# Patient Record
Sex: Female | Born: 1960 | Race: White | Hispanic: No | Marital: Married | Smoking: Current every day smoker
Health system: Southern US, Community
[De-identification: ages and names within clinical notes are randomized; demographics above are authoritative.]

## PROBLEM LIST (undated history)

## (undated) DIAGNOSIS — K921 Melena: Secondary | ICD-10-CM

## (undated) DIAGNOSIS — T7840XA Allergy, unspecified, initial encounter: Secondary | ICD-10-CM

## (undated) DIAGNOSIS — J40 Bronchitis, not specified as acute or chronic: Secondary | ICD-10-CM

## (undated) DIAGNOSIS — R51 Headache: Secondary | ICD-10-CM

## (undated) DIAGNOSIS — D649 Anemia, unspecified: Secondary | ICD-10-CM

## (undated) DIAGNOSIS — R519 Headache, unspecified: Secondary | ICD-10-CM

## (undated) DIAGNOSIS — G8929 Other chronic pain: Secondary | ICD-10-CM

## (undated) DIAGNOSIS — S2220XA Unspecified fracture of sternum, initial encounter for closed fracture: Secondary | ICD-10-CM

## (undated) DIAGNOSIS — O24419 Gestational diabetes mellitus in pregnancy, unspecified control: Secondary | ICD-10-CM

## (undated) DIAGNOSIS — R198 Other specified symptoms and signs involving the digestive system and abdomen: Secondary | ICD-10-CM

## (undated) HISTORY — PX: BREAST LUMPECTOMY: SHX2

## (undated) HISTORY — DX: Allergy, unspecified, initial encounter: T78.40XA

## (undated) HISTORY — DX: Other specified symptoms and signs involving the digestive system and abdomen: R19.8

## (undated) HISTORY — DX: Bronchitis, not specified as acute or chronic: J40

## (undated) HISTORY — PX: ABDOMINAL HYSTERECTOMY: SHX81

## (undated) HISTORY — DX: Headache: R51

## (undated) HISTORY — DX: Anemia, unspecified: D64.9

## (undated) HISTORY — DX: Unspecified fracture of sternum, initial encounter for closed fracture: S22.20XA

## (undated) HISTORY — DX: Gestational diabetes mellitus in pregnancy, unspecified control: O24.419

## (undated) HISTORY — PX: TUBAL LIGATION: SHX77

## (undated) HISTORY — DX: Headache, unspecified: R51.9

## (undated) HISTORY — DX: Other chronic pain: G89.29

## (undated) HISTORY — DX: Melena: K92.1

---

## 2008-04-20 ENCOUNTER — Ambulatory Visit: Payer: Self-pay | Admitting: Unknown Physician Specialty

## 2008-05-07 ENCOUNTER — Ambulatory Visit: Payer: Self-pay | Admitting: Unknown Physician Specialty

## 2012-06-20 ENCOUNTER — Ambulatory Visit (INDEPENDENT_AMBULATORY_CARE_PROVIDER_SITE_OTHER): Payer: 59 | Admitting: Internal Medicine

## 2012-06-20 ENCOUNTER — Encounter: Payer: Self-pay | Admitting: Internal Medicine

## 2012-06-20 ENCOUNTER — Other Ambulatory Visit: Payer: Self-pay | Admitting: Internal Medicine

## 2012-06-20 VITALS — BP 120/80 | HR 88 | Temp 98.1°F | Ht 65.0 in | Wt 245.0 lb

## 2012-06-20 DIAGNOSIS — R1031 Right lower quadrant pain: Secondary | ICD-10-CM | POA: Insufficient documentation

## 2012-06-20 DIAGNOSIS — R0609 Other forms of dyspnea: Secondary | ICD-10-CM | POA: Insufficient documentation

## 2012-06-20 DIAGNOSIS — M25552 Pain in left hip: Secondary | ICD-10-CM

## 2012-06-20 DIAGNOSIS — R197 Diarrhea, unspecified: Secondary | ICD-10-CM | POA: Insufficient documentation

## 2012-06-20 DIAGNOSIS — K921 Melena: Secondary | ICD-10-CM

## 2012-06-20 DIAGNOSIS — F17200 Nicotine dependence, unspecified, uncomplicated: Secondary | ICD-10-CM | POA: Insufficient documentation

## 2012-06-20 DIAGNOSIS — M25561 Pain in right knee: Secondary | ICD-10-CM | POA: Insufficient documentation

## 2012-06-20 DIAGNOSIS — M25562 Pain in left knee: Secondary | ICD-10-CM

## 2012-06-20 DIAGNOSIS — M25559 Pain in unspecified hip: Secondary | ICD-10-CM

## 2012-06-20 DIAGNOSIS — M25569 Pain in unspecified knee: Secondary | ICD-10-CM

## 2012-06-20 NOTE — Assessment & Plan Note (Signed)
Body mass index is 40.77 kg/(m^2).  Encouraged effort at healthy diet. Exercise limited at present by knee pain and hip pain. Will check labs including A1c and TSH.

## 2012-06-20 NOTE — Assessment & Plan Note (Signed)
Likely secondary to osteoarthritis. Will request records from previous orthopedic evaluation. After review of previous imaging, will decide on additional evaluation.

## 2012-06-20 NOTE — Assessment & Plan Note (Signed)
Symptoms of dyspnea on exertion are most likely related to tobacco abuse, COPD, obesity. Cardiac ischemia is also a consideration given patient's history of tobacco abuse and obesity. EKG showed no acute findings today. Discussed potential referral for additional evaluation with stress test, however will hold off on evaluation until after acute issues with GI bleeding resolved.

## 2012-06-20 NOTE — Assessment & Plan Note (Signed)
Symptoms and exam are most consistent with osteoarthritis. Will request records on previous orthopedic evaluation.

## 2012-06-20 NOTE — Assessment & Plan Note (Signed)
Encouraged smoking cessation. Pt does not feel ready to quit.

## 2012-06-20 NOTE — Assessment & Plan Note (Signed)
Chronic, watery diarrhea on daily basis. Given intermittent episodes of grossly bloody stools, question if symptoms may be related to inflammatory bowel disease. Will get CT abdomen. Will set up GI evaluation for colonoscopy. Will check CMP, CBC with labs.

## 2012-06-20 NOTE — Assessment & Plan Note (Signed)
Chronic RLQ abdominal pain. Question if pt may have chronic appendicitis or IBD contributing to symptoms. Will get CT abdomen for further evaluation.

## 2012-06-20 NOTE — Progress Notes (Signed)
Subjective:    Patient ID: Briana Walter, female    DOB: 1961-02-18, 52 y.o.   MRN: [id number]  HPI 52 year old female with history of obesity, tobacco abuse, chronic bilateral knee and hip pain, and chronic diarrhea presents to establish care. She has several concerns today. First, she notes several year history of watery diarrhea. She typically has greater than 5-10 episodes of watery diarrhea per day. Occasionally, she has grossly bloody stools. This is typically associated with right lower abdominal pain and sometimes right back pain. She had a colonoscopy in the distant past which she reports was normal. She has not had any weight loss with these episodes. She denies persistent abdominal pain, fever, chills. She does have acid reflux symptoms which are improved with Zantac. She describes this as burning with occasional nausea and belching.  Second, she notes significant dyspnea on exertion. She reports that she sometimes become short of breath after walking several 100 feet. She is a smoker and smokes about one pack per day. She does not exercise. In the past, she has been treated for recurrent episodes of bronchitis. She has never been diagnosed with COPD. She denies any chest pain with exertion. She denies diaphoresis or palpitations.   Third, she is concerned about chronic bilateral knee and left hip pain. In the past she was evaluated by orthopedic surgery and reports having imaging of her knees. She has been using Aleve to help with symptoms of pain with minimal improvement. The pain in her knees is described as aching occasionally wakes her from sleep.  Fourth, in regards to smoking, she reports she would like to quit smoking but is concerned about the potential of gaining more weight. She does not feel ready to quit.  Outpatient Encounter Prescriptions as of 06/20/2012  Medication Sig Dispense Refill  . diphenhydrAMINE (BENADRYL) 50 MG capsule Take 50 mg by mouth every 6 (six) hours as  needed for itching.      . loratadine (CLARITIN) 10 MG tablet Take 10 mg by mouth daily.      . naproxen sodium (ANAPROX) 220 MG tablet Take 220 mg by mouth 2 (two) times daily with a meal.      . ranitidine (ZANTAC) 150 MG tablet Take 150 mg by mouth 2 (two) times daily.       No facility-administered encounter medications on file as of 06/20/2012.   BP 120/80  Pulse 88  Temp(Src) 98.1 F (36.7 C) (Oral)  Ht 5\' 5"  (1.651 m)  Wt 245 lb (111.131 kg)  BMI 40.77 kg/m2  SpO2 97%  Review of Systems  Constitutional: Positive for fatigue. Negative for fever, chills, appetite change and unexpected weight change.  HENT: Negative for ear pain, congestion, sore throat, trouble swallowing, neck pain, voice change and sinus pressure.   Eyes: Negative for visual disturbance.  Respiratory: Positive for shortness of breath. Negative for cough, wheezing and stridor.   Cardiovascular: Negative for chest pain, palpitations and leg swelling.  Gastrointestinal: Positive for nausea, abdominal pain, diarrhea and blood in stool. Negative for vomiting, constipation, abdominal distention and anal bleeding.  Genitourinary: Negative for dysuria and flank pain.  Musculoskeletal: Positive for myalgias and arthralgias. Negative for gait problem.  Skin: Negative for color change and rash.  Neurological: Negative for dizziness and headaches.  Hematological: Negative for adenopathy. Does not bruise/bleed easily.  Psychiatric/Behavioral: Negative for suicidal ideas, sleep disturbance and dysphoric mood. The patient is not nervous/anxious.        Objective:   Physical Exam  Constitutional: She is oriented to person, place, and time. She appears well-developed and well-nourished. No distress.  HENT:  Head: Normocephalic and atraumatic.  Right Ear: External ear normal.  Left Ear: External ear normal.  Nose: Nose normal.  Mouth/Throat: Oropharynx is clear and moist. No oropharyngeal exudate.  Eyes: Conjunctivae are  normal. Pupils are equal, round, and reactive to light. Right eye exhibits no discharge. Left eye exhibits no discharge. No scleral icterus.  Neck: Normal range of motion. Neck supple. No tracheal deviation present. No thyromegaly present.  Cardiovascular: Normal rate, regular rhythm, normal heart sounds and intact distal pulses.  Exam reveals no gallop and no friction rub.   No murmur heard. Pulmonary/Chest: Effort normal and breath sounds normal. No accessory muscle usage. Not tachypneic. No respiratory distress. She has no decreased breath sounds. She has no wheezes. She has no rhonchi. She has no rales. She exhibits no tenderness.  Abdominal: Normal appearance and bowel sounds are normal. There is tenderness in the right lower quadrant. There is tenderness at McBurney's point. There is no CVA tenderness.  Musculoskeletal: She exhibits no edema and no tenderness.       Right knee: She exhibits normal range of motion, no swelling and no effusion.       Left knee: She exhibits normal range of motion, no swelling and no effusion.  Crepitus bilateral knees  Lymphadenopathy:    She has no cervical adenopathy.  Neurological: She is alert and oriented to person, place, and time. No cranial nerve deficit. She exhibits normal muscle tone. Coordination normal.  Skin: Skin is warm and dry. No rash noted. She is not diaphoretic. No erythema. No pallor.  Psychiatric: She has a normal mood and affect. Her behavior is normal. Judgment and thought content normal.          Assessment & Plan:

## 2012-06-20 NOTE — Assessment & Plan Note (Signed)
Chronic intermittent episodes of grossly bloody stool associated with abdominal pain are concerning for inflammatory colitis. Will get CT of the abdomen for further evaluation. Will check CBC, CMP with labs today. Will set up direct visualization with colonoscopy with GI referral.

## 2012-06-21 ENCOUNTER — Ambulatory Visit: Payer: Self-pay | Admitting: Internal Medicine

## 2012-06-23 LAB — CBC WITH DIFFERENTIAL
Basophils Absolute: 0 10*3/uL (ref 0.0–0.2)
HCT: 42.4 % (ref 34.0–46.6)
Lymphocytes Absolute: 1.7 10*3/uL (ref 0.7–3.1)
MCV: 86 fL (ref 79–97)
Monocytes: 7 % (ref 4–12)
Neutrophils Absolute: 3.2 10*3/uL (ref 1.4–7.0)
RDW: 13.1 % (ref 12.3–15.4)
WBC: 5.4 10*3/uL (ref 3.4–10.8)

## 2012-06-23 LAB — COMPREHENSIVE METABOLIC PANEL
Albumin/Globulin Ratio: 1.9 (ref 1.1–2.5)
Albumin: 4.6 g/dL (ref 3.5–5.5)
Alkaline Phosphatase: 73 IU/L (ref 39–117)
BUN/Creatinine Ratio: 15 (ref 9–23)
BUN: 13 mg/dL (ref 6–24)
Chloride: 104 mmol/L (ref 97–108)
Creatinine, Ser: 0.85 mg/dL (ref 0.57–1.00)
GFR calc Af Amer: 93 mL/min/{1.73_m2} (ref >59)
Globulin, Total: 2.4 g/dL (ref 1.5–4.5)
Total Bilirubin: 0.5 mg/dL (ref 0.0–1.2)

## 2012-06-23 LAB — LIPID PANEL W/O CHOL/HDL RATIO
HDL: 54 mg/dL (ref >39)
LDL Calculated: 136 mg/dL — ABNORMAL HIGH (ref 0–99)

## 2012-06-23 LAB — HGB A1C W/O EAG: Hgb A1c MFr Bld: 5.5 % (ref 4.8–5.6)

## 2012-06-23 LAB — H. PYLORI BREATH TEST: H. pylori UBiT: NEGATIVE

## 2012-06-23 LAB — TSH: TSH: 1.63 u[IU]/mL (ref 0.450–4.500)

## 2012-06-24 ENCOUNTER — Encounter: Payer: Self-pay | Admitting: Internal Medicine

## 2012-06-24 ENCOUNTER — Telehealth: Payer: Self-pay | Admitting: Internal Medicine

## 2012-06-24 NOTE — Telephone Encounter (Signed)
CT abdomen 06/21/2012 was normal.

## 2012-06-25 NOTE — Telephone Encounter (Signed)
Left message to call back  

## 2012-06-26 NOTE — Telephone Encounter (Signed)
Patient informed and verbally agreed understanding.  

## 2012-06-26 NOTE — Telephone Encounter (Signed)
Left message to call back  

## 2012-07-04 ENCOUNTER — Ambulatory Visit: Payer: 59 | Admitting: Internal Medicine

## 2012-07-11 ENCOUNTER — Encounter: Payer: Self-pay | Admitting: Internal Medicine

## 2013-01-09 ENCOUNTER — Other Ambulatory Visit: Payer: Self-pay

## 2014-03-18 ENCOUNTER — Telehealth: Payer: Self-pay | Admitting: Internal Medicine

## 2014-03-18 NOTE — Telephone Encounter (Signed)
Sent mychart with advice. Also advised pt she needs to schedule appt with dr. Dan HumphreysWalker, been almost 2 years since last appt 4/14.

## 2014-03-18 NOTE — Telephone Encounter (Addendum)
Pt request to have a mammogram ordered and scheduled on a Friday. Pt also request for a colonoscopy.Please advise pt/msn

## 2014-04-17 ENCOUNTER — Ambulatory Visit (INDEPENDENT_AMBULATORY_CARE_PROVIDER_SITE_OTHER): Payer: 59 | Admitting: Internal Medicine

## 2014-04-17 ENCOUNTER — Encounter: Payer: Self-pay | Admitting: Internal Medicine

## 2014-04-17 VITALS — BP 132/84 | HR 80 | Temp 98.0°F | Resp 14 | Ht 65.25 in | Wt 245.5 lb

## 2014-04-17 DIAGNOSIS — L292 Pruritus vulvae: Secondary | ICD-10-CM

## 2014-04-17 DIAGNOSIS — Z0000 Encounter for general adult medical examination without abnormal findings: Secondary | ICD-10-CM | POA: Insufficient documentation

## 2014-04-17 DIAGNOSIS — G4730 Sleep apnea, unspecified: Secondary | ICD-10-CM

## 2014-04-17 DIAGNOSIS — Z720 Tobacco use: Secondary | ICD-10-CM

## 2014-04-17 DIAGNOSIS — F17200 Nicotine dependence, unspecified, uncomplicated: Secondary | ICD-10-CM

## 2014-04-17 DIAGNOSIS — Z634 Disappearance and death of family member: Secondary | ICD-10-CM

## 2014-04-17 LAB — HM PAP SMEAR: HM PAP: NEGATIVE

## 2014-04-17 MED ORDER — NYSTATIN 100000 UNIT/GM EX POWD: CUTANEOUS | Status: DC

## 2014-04-17 MED ORDER — GENTAMICIN SULFATE 0.1 % EX OINT: 1.0000 "application " | TOPICAL_OINTMENT | Freq: Three times a day (TID) | CUTANEOUS | Status: DC

## 2014-04-17 NOTE — Progress Notes (Signed)
Subjective:    Patient ID: Carola RhineLeslie T Scarbro, female    DOB: 11/15/1960, 54 y.o.   MRN: [id number]  HPI  54YO female presents for annual exam.  Vulvar itching - ongoing over last several weeks. Only itching externally. No vaginal discharge noted. Not using anything for this.  Sleep apnea - Husband has reported she is snoring and also has apneic events. Feels tired during the day. Having some trouble staying asleep  Son committed suicide last year. Continues to struggle with this. Tearful at times. Has not yet sought counseling, but considering this.  Past medical, surgical, family and social history per today's encounter.  Review of Systems  Constitutional: Negative for fever, chills, appetite change, fatigue and unexpected weight change.  Eyes: Negative for visual disturbance.  Respiratory: Negative for shortness of breath.   Cardiovascular: Negative for chest pain and leg swelling.  Gastrointestinal: Negative for nausea, vomiting, abdominal pain, diarrhea and constipation.  Genitourinary: Negative for vaginal bleeding, vaginal discharge, genital sores, vaginal pain and pelvic pain.  Musculoskeletal: Negative for myalgias and arthralgias.  Skin: Positive for rash.  Hematological: Negative for adenopathy. Does not bruise/bleed easily.  Psychiatric/Behavioral: Positive for sleep disturbance and dysphoric mood. Negative for suicidal ideas. The patient is not nervous/anxious.        Objective:    BP 132/84 mmHg  Pulse 80  Temp(Src) 98 F (36.7 C) (Oral)  Resp 14  Ht 5' 5.25" (1.657 m)  Wt 245 lb 8 oz (111.358 kg)  BMI 40.56 kg/m2  SpO2 97% Physical Exam  Constitutional: She is oriented to person, place, and time. She appears well-developed and well-nourished. No distress.  HENT:  Head: Normocephalic and atraumatic.  Right Ear: External ear normal.  Left Ear: External ear normal.  Nose: Nose normal.  Mouth/Throat: Oropharynx is clear and moist. No oropharyngeal exudate.    Eyes: Conjunctivae are normal. Pupils are equal, round, and reactive to light. Right eye exhibits no discharge. Left eye exhibits no discharge. No scleral icterus.  Neck: Normal range of motion. Neck supple. No tracheal deviation present. No thyromegaly present.  Cardiovascular: Normal rate, regular rhythm, normal heart sounds and intact distal pulses.  Exam reveals no gallop and no friction rub.   No murmur heard. Pulmonary/Chest: Effort normal and breath sounds normal. No respiratory distress. She has no wheezes. She has no rales. She exhibits no tenderness.  Abdominal: Soft. Bowel sounds are normal. She exhibits no distension and no mass. There is no tenderness. There is no rebound and no guarding.  Genitourinary: Rectum normal, vagina normal and uterus normal. No breast swelling, tenderness, discharge or bleeding. Pelvic exam was performed with patient supine. There is no rash, tenderness or lesion on the right labia. There is no rash, tenderness or lesion on the left labia. Uterus is not enlarged and not tender. Cervix exhibits no motion tenderness, no discharge and no friability. Right adnexum displays no mass, no tenderness and no fullness. Left adnexum displays no mass, no tenderness and no fullness. No erythema or tenderness in the vagina. No vaginal discharge found.  Musculoskeletal: Normal range of motion. She exhibits no edema or tenderness.  Lymphadenopathy:    She has no cervical adenopathy.  Neurological: She is alert and oriented to person, place, and time. No cranial nerve deficit. She exhibits normal muscle tone. Coordination normal.  Skin: Skin is warm and dry. No rash noted. She is not diaphoretic. No erythema. No pallor.     Psychiatric: She has a normal mood and affect.  Her behavior is normal. Judgment and thought content normal.          Assessment & Plan:   Problem List Items Addressed This Visit      Unprioritized   Bereavement    Encouraged her to consider  referral for counseling given recent death of her son. She will look into counseling at work.      Routine general medical examination at a health care facility - Primary    General medical exam including breast and pelvic exam normal today except as noted. PAP pending. Mammogram ordered. Colonoscopy ordered. Labs today including CBC, CMP, lipids, TSH, A1c, Vit D. Flu vaccine declined.      Relevant Orders   MM Digital Screening   Ambulatory referral to Gastroenterology   Hemoglobin A1c   TSH   Hepatitis B surface antigen   Hepatitis B surface antibody   Hepatitis C antibody   HIV antibody   Comprehensive metabolic panel   Lipid panel   CBC with Differential/Platelet   Vit D  25 hydroxy (rtn osteoporosis monitoring)   GC/chlamydia probe amp, urine   Severe obesity (BMI >= 40)    Wt Readings from Last 3 Encounters:  04/17/14 245 lb 8 oz (111.358 kg)  06/20/12 245 lb (111.131 kg)   Body mass index is 40.56 kg/(m^2). Encouraged healthy diet and exercise. Gave handout on this.      Sleep apnea    Symptoms of witnessed snoring and apnea. Encouraged her to consider sleep study. She declines for now.       Tobacco use disorder    Encourage smoking cessation.      Vulvar itching    Exam is consistent with cutaneous candidiasis with small area of folliculitis left upper groin. Will start topical Gentamicin to folliculitis and use bid Nystatin powder to treat candidiasis. Follow up prn.      Relevant Medications   Nystatin (MYCOSTATIN) 100,000 units/Gm top powder   gentamicin (GARAMYCIN) ointment 0.1%       Return in about 3 months (around 07/16/2014) for Recheck.

## 2014-04-17 NOTE — Assessment & Plan Note (Signed)
Wt Readings from Last 3 Encounters:  04/17/14 245 lb 8 oz (111.358 kg)  06/20/12 245 lb (111.131 kg)   Body mass index is 40.56 kg/(m^2). Encouraged healthy diet and exercise. Gave handout on this.

## 2014-04-17 NOTE — Progress Notes (Signed)
Pre visit review using our clinic review tool, if applicable. No additional management support is needed unless otherwise documented below in the visit note. 

## 2014-04-17 NOTE — Assessment & Plan Note (Signed)
Encourage smoking cessation

## 2014-04-17 NOTE — Assessment & Plan Note (Signed)
Symptoms of witnessed snoring and apnea. Encouraged her to consider sleep study. She declines for now.

## 2014-04-17 NOTE — Patient Instructions (Signed)

## 2014-04-17 NOTE — Assessment & Plan Note (Signed)
General medical exam including breast and pelvic exam normal today except as noted. PAP pending. Mammogram ordered. Colonoscopy ordered. Labs today including CBC, CMP, lipids, TSH, A1c, Vit D. Flu vaccine declined.

## 2014-04-17 NOTE — Assessment & Plan Note (Signed)
Encouraged her to consider referral for counseling given recent death of her son. She will look into counseling at work.

## 2014-04-17 NOTE — Assessment & Plan Note (Signed)
Exam is consistent with cutaneous candidiasis with small area of folliculitis left upper groin. Will start topical Gentamicin to folliculitis and use bid Nystatin powder to treat candidiasis. Follow up prn.

## 2014-04-17 NOTE — Addendum Note (Signed)
Addended by: Montine CircleMALDONADO, Armanie Martine D on: 04/17/2014 02:17 PM   Modules accepted: Orders

## 2014-04-18 LAB — GC/CHLAMYDIA PROBE AMP, URINE
Chlamydia, Swab/Urine, PCR: NEGATIVE
GC Probe Amp, Urine: NEGATIVE

## 2014-04-20 ENCOUNTER — Telehealth: Payer: Self-pay | Admitting: Internal Medicine

## 2014-04-20 ENCOUNTER — Telehealth: Payer: Self-pay | Admitting: *Deleted

## 2014-04-20 NOTE — Telephone Encounter (Signed)
Spoke with Briana Walter, she states all other labs will be resulted.

## 2014-04-20 NOTE — Telephone Encounter (Signed)
OK. Please just ask them to run PAP with HPV then

## 2014-04-20 NOTE — Telephone Encounter (Signed)
Dana from [facility] called states they are unable to result the Candida and Gardnerella.  Those result can not be processed from a Thin Prep, they have to done from an APTIMA swab.  Please advise

## 2014-04-20 NOTE — Telephone Encounter (Signed)
emmi emailed °

## 2014-04-21 LAB — CBC WITH DIFFERENTIAL/PLATELET
Basophils Absolute: 0 10*3/uL (ref 0.0–0.2)
Basos: 1 %
Eos: 3 %
Eosinophils Absolute: 0.1 10*3/uL (ref 0.0–0.4)
HCT: 39.6 % (ref 34.0–46.6)
Hemoglobin: 13.9 g/dL (ref 11.1–15.9)
IMMATURE GRANS (ABS): 0 10*3/uL (ref 0.0–0.1)
Immature Granulocytes: 0 %
LYMPHS: 33 %
Lymphocytes Absolute: 1.2 10*3/uL (ref 0.7–3.1)
MCH: 30.5 pg (ref 26.6–33.0)
MCHC: 35.1 g/dL (ref 31.5–35.7)
MCV: 87 fL (ref 79–97)
Monocytes Absolute: 0.3 10*3/uL (ref 0.1–0.9)
Monocytes: 7 %
NEUTROS PCT: 56 %
Neutrophils Absolute: 2.2 10*3/uL (ref 1.4–7.0)
PLATELETS: 325 10*3/uL (ref 150–379)
RBC: 4.55 x10E6/uL (ref 3.77–5.28)
RDW: 13.1 % (ref 12.3–15.4)
WBC: 3.8 10*3/uL (ref 3.4–10.8)

## 2014-04-21 LAB — LIPID PANEL
Chol/HDL Ratio: 4.5 ratio units — ABNORMAL HIGH (ref 0.0–4.4)
Cholesterol, Total: 245 mg/dL — ABNORMAL HIGH (ref 100–199)
HDL: 55 mg/dL (ref >39)
LDL Calculated: 159 mg/dL — ABNORMAL HIGH (ref 0–99)
Triglycerides: 153 mg/dL — ABNORMAL HIGH (ref 0–149)
VLDL Cholesterol Cal: 31 mg/dL (ref 5–40)

## 2014-04-21 LAB — HEMOGLOBIN A1C
Est. average glucose Bld gHb Est-mCnc: 105 mg/dL
Hgb A1c MFr Bld: 5.3 % (ref 4.8–5.6)

## 2014-04-21 LAB — COMPREHENSIVE METABOLIC PANEL
ALT: 22 IU/L (ref 0–32)
AST: 21 IU/L (ref 0–40)
Albumin/Globulin Ratio: 1.9 (ref 1.1–2.5)
Albumin: 4.3 g/dL (ref 3.5–5.5)
Alkaline Phosphatase: 71 IU/L (ref 39–117)
BUN / CREAT RATIO: 24 — AB (ref 9–23)
BUN: 16 mg/dL (ref 6–24)
Bilirubin Total: 0.4 mg/dL (ref 0.0–1.2)
CHLORIDE: 105 mmol/L (ref 97–108)
CO2: 21 mmol/L (ref 18–29)
Calcium: 9.2 mg/dL (ref 8.7–10.2)
Creatinine, Ser: 0.67 mg/dL (ref 0.57–1.00)
GFR calc Af Amer: 118 mL/min/{1.73_m2} (ref >59)
GFR calc non Af Amer: 102 mL/min/{1.73_m2} (ref >59)
GLUCOSE: 102 mg/dL — AB (ref 65–99)
Globulin, Total: 2.3 g/dL (ref 1.5–4.5)
POTASSIUM: 4.4 mmol/L (ref 3.5–5.2)
Sodium: 140 mmol/L (ref 134–144)
Total Protein: 6.6 g/dL (ref 6.0–8.5)

## 2014-04-21 LAB — HIV ANTIBODY (ROUTINE TESTING W REFLEX): HIV Screen 4th Generation wRfx: NONREACTIVE

## 2014-04-21 LAB — VITAMIN D 25 HYDROXY (VIT D DEFICIENCY, FRACTURES): VIT D 25 HYDROXY: 12 ng/mL — AB (ref 30.0–100.0)

## 2014-04-21 LAB — HEPATITIS B SURFACE ANTIGEN: Hepatitis B Surface Ag: NEGATIVE

## 2014-04-21 LAB — HEPATITIS C ANTIBODY: Hep C Virus Ab: <0.1 s/co ratio (ref 0.0–0.9)

## 2014-04-21 LAB — TSH: TSH: 1.71 u[IU]/mL (ref 0.450–4.500)

## 2014-04-21 LAB — HEPATITIS B SURFACE ANTIBODY,QUALITATIVE: Hep B Surface Ab, Qual: NONREACTIVE

## 2014-05-14 ENCOUNTER — Telehealth: Payer: Self-pay | Admitting: *Deleted

## 2014-05-14 NOTE — Telephone Encounter (Signed)
Pt left VM, requesting pap results. [name] requested results from [facility]. Pap negative, High risk HPV negative. Left message, notifying of results.

## 2014-06-23 LAB — HM COLONOSCOPY

## 2014-06-30 ENCOUNTER — Encounter: Payer: Self-pay | Admitting: *Deleted

## 2014-07-16 ENCOUNTER — Encounter: Payer: Self-pay | Admitting: Internal Medicine

## 2014-07-16 ENCOUNTER — Ambulatory Visit (INDEPENDENT_AMBULATORY_CARE_PROVIDER_SITE_OTHER): Payer: 59 | Admitting: Internal Medicine

## 2014-07-16 VITALS — BP 138/80 | HR 115 | Temp 98.0°F | Ht 65.25 in | Wt 236.5 lb

## 2014-07-16 DIAGNOSIS — R197 Diarrhea, unspecified: Secondary | ICD-10-CM

## 2014-07-16 NOTE — Patient Instructions (Signed)

## 2014-07-16 NOTE — Progress Notes (Signed)
Pre visit review using our clinic review tool, if applicable. No additional management support is needed unless otherwise documented below in the visit note. 

## 2014-07-16 NOTE — Progress Notes (Signed)
Subjective:    Patient ID: Briana RhineLeslie T Vanderweele, female    DOB: 09/18/1960, 54 y.o.   MRN: [id number]  HPI  54YO female presents for acute visit.  Diarrhea - Started Sunday night. Watery, non-bloody. Numerous episodes per day. Some cramping prior to episodes. No travel. No NV. No fever. Stayed home from work Monday and Tuesday. Worked from home yesterday. No other family members ill. Also notes that throat has felt "tickly" and has had dry cough. Sipping on sprite today, but has not eaten.  Wt Readings from Last 3 Encounters:  07/16/14 236 lb 8 oz (107.276 kg)  04/17/14 245 lb 8 oz (111.358 kg)  06/20/12 245 lb (111.131 kg)     Past medical, surgical, family and social history per today's encounter.  Review of Systems  Constitutional: Positive for fatigue. Negative for fever, chills, appetite change and unexpected weight change.  Eyes: Negative for visual disturbance.  Respiratory: Positive for cough. Negative for shortness of breath.   Cardiovascular: Negative for chest pain and leg swelling.  Gastrointestinal: Positive for abdominal pain and diarrhea. Negative for nausea, vomiting, constipation and blood in stool.  Skin: Negative for color change and rash.  Hematological: Negative for adenopathy. Does not bruise/bleed easily.  Psychiatric/Behavioral: Negative for dysphoric mood. The patient is not nervous/anxious.        Objective:    BP 138/80 mmHg  Pulse 115  Temp(Src) 98 F (36.7 C) (Oral)  Ht 5' 5.25" (1.657 m)  Wt 236 lb 8 oz (107.276 kg)  BMI 39.07 kg/m2  SpO2 94% Physical Exam  Constitutional: She is oriented to person, place, and time. She appears well-developed and well-nourished. No distress.  HENT:  Head: Normocephalic and atraumatic.  Right Ear: External ear normal.  Left Ear: External ear normal.  Nose: Nose normal.  Mouth/Throat: Oropharynx is clear and moist. No oropharyngeal exudate.  Eyes: Conjunctivae are normal. Pupils are equal, round, and  reactive to light. Right eye exhibits no discharge. Left eye exhibits no discharge. No scleral icterus.  Neck: Normal range of motion. Neck supple. No tracheal deviation present. No thyromegaly present.  Cardiovascular: Normal rate, regular rhythm, normal heart sounds and intact distal pulses.  Exam reveals no gallop and no friction rub.   No murmur heard. Pulmonary/Chest: Effort normal and breath sounds normal. No respiratory distress. She has no wheezes. She has no rales. She exhibits no tenderness.  Musculoskeletal: Normal range of motion. She exhibits no edema or tenderness.  Lymphadenopathy:    She has no cervical adenopathy.  Neurological: She is alert and oriented to person, place, and time. No cranial nerve deficit. She exhibits normal muscle tone. Coordination normal.  Skin: Skin is warm and dry. No rash noted. She is not diaphoretic. No erythema. No pallor.  Psychiatric: She has a normal mood and affect. Her behavior is normal. Judgment and thought content normal.          Assessment & Plan:   Problem List Items Addressed This Visit      Unprioritized   Diarrhea - Primary    Symptoms are most consistent with viral infection, however given persistent diarrhea, will check for bacterial cause. Stool culture and Cdiff. Labs today including CBC, CMP. Encouraged continued po hydration. Follow up in 1 week and prn.      Relevant Orders   Comprehensive metabolic panel   CBC with Differential/Platelet   Stool Culture   Stool C-Diff Toxin Assay       Return in about 1 week (  around 07/23/2014) for Recheck.

## 2014-07-16 NOTE — Assessment & Plan Note (Signed)
Symptoms are most consistent with viral infection, however given persistent diarrhea, will check for bacterial cause. Stool culture and Cdiff. Labs today including CBC, CMP. Encouraged continued po hydration. Follow up in 1 week and prn.

## 2014-07-17 ENCOUNTER — Telehealth: Payer: Self-pay

## 2014-07-17 LAB — CBC WITH DIFFERENTIAL/PLATELET
BASOS ABS: 0 10*3/uL (ref 0.0–0.1)
BASOS PCT: 0 % (ref 0.0–3.0)
Eosinophils Absolute: 0.4 10*3/uL (ref 0.0–0.7)
Eosinophils Relative: 5.9 % — ABNORMAL HIGH (ref 0.0–5.0)
HCT: 45.3 % (ref 36.0–46.0)
Hemoglobin: 16 g/dL — ABNORMAL HIGH (ref 12.0–15.0)
Lymphocytes Relative: 7.9 % — ABNORMAL LOW (ref 12.0–46.0)
Lymphs Abs: 0.5 10*3/uL — ABNORMAL LOW (ref 0.7–4.0)
MCHC: 35.3 g/dL (ref 30.0–36.0)
MCV: 84.8 fl (ref 78.0–100.0)
MONO ABS: 0.4 10*3/uL (ref 0.1–1.0)
Monocytes Relative: 5.9 % (ref 3.0–12.0)
NEUTROS ABS: 5.3 10*3/uL (ref 1.4–7.7)
Neutrophils Relative %: 80.3 % — ABNORMAL HIGH (ref 43.0–77.0)
Platelets: 379 10*3/uL (ref 150.0–400.0)
RBC: 5.34 Mil/uL — ABNORMAL HIGH (ref 3.87–5.11)
RDW: 13.2 % (ref 11.5–15.5)
WBC: 6.6 10*3/uL (ref 4.0–10.5)

## 2014-07-17 LAB — COMPREHENSIVE METABOLIC PANEL
ALK PHOS: 89 U/L (ref 39–117)
ALT: 75 U/L — ABNORMAL HIGH (ref 0–35)
AST: 66 U/L — AB (ref 0–37)
Albumin: 4.5 g/dL (ref 3.5–5.2)
BILIRUBIN TOTAL: 0.8 mg/dL (ref 0.2–1.2)
BUN: 14 mg/dL (ref 6–23)
CO2: 21 meq/L (ref 19–32)
Calcium: 10.2 mg/dL (ref 8.4–10.5)
Chloride: 107 mEq/L (ref 96–112)
Creatinine, Ser: 1.04 mg/dL (ref 0.40–1.20)
GFR: 59.14 mL/min — AB (ref >60.00)
GLUCOSE: 130 mg/dL — AB (ref 70–99)
Potassium: 4 mEq/L (ref 3.5–5.1)
Sodium: 139 mEq/L (ref 135–145)
Total Protein: 7.6 g/dL (ref 6.0–8.3)

## 2014-07-17 LAB — C. DIFFICILE GDH AND TOXIN A/B
C. difficile GDH: NOT DETECTED
C. difficile Toxin A/B: NOT DETECTED

## 2014-07-17 NOTE — Telephone Encounter (Signed)
The patient called hoping to get any results that were available for testing she had done yesterday.

## 2014-07-17 NOTE — Telephone Encounter (Signed)
When the results come in we will give her a call

## 2014-07-20 LAB — STOOL CULTURE

## 2014-07-21 NOTE — Telephone Encounter (Signed)
Lab is faxing over information, per Wynelle ClevelandCarolyn M lab states there was not enough stool for the C Dif test

## 2014-07-22 ENCOUNTER — Telehealth: Payer: Self-pay | Admitting: Internal Medicine

## 2014-07-22 NOTE — Telephone Encounter (Signed)
Stool culture and testing for CDIff were negative, by faxed report 5/17

## 2014-07-23 ENCOUNTER — Ambulatory Visit (INDEPENDENT_AMBULATORY_CARE_PROVIDER_SITE_OTHER): Payer: 59 | Admitting: Internal Medicine

## 2014-07-23 ENCOUNTER — Encounter: Payer: Self-pay | Admitting: Internal Medicine

## 2014-07-23 VITALS — BP 132/81 | HR 77 | Temp 97.8°F | Ht 65.25 in | Wt 239.2 lb

## 2014-07-23 DIAGNOSIS — R197 Diarrhea, unspecified: Secondary | ICD-10-CM | POA: Diagnosis not present

## 2014-07-23 DIAGNOSIS — R7989 Other specified abnormal findings of blood chemistry: Secondary | ICD-10-CM | POA: Diagnosis not present

## 2014-07-23 DIAGNOSIS — R945 Abnormal results of liver function studies: Secondary | ICD-10-CM

## 2014-07-23 NOTE — Assessment & Plan Note (Signed)
Mild recent elevation of LFTs. Likely viral infection. Will repeat today. If persistent elevation, plan to check RUQ US.

## 2014-07-23 NOTE — Progress Notes (Signed)
Pre visit review using our clinic review tool, if applicable. No additional management support is needed unless otherwise documented below in the visit note. 

## 2014-07-23 NOTE — Assessment & Plan Note (Signed)
Diarrhea has improved somewhat, now back at baseline, however still significant diarrhea 5-6 episodes per day. She does not want to follow up with GI given cost of visit. Recommended FODMAP diet. Discussed potentially starting Xifaxan and using anti-diarrheal medications. Follow up in 2 weeks.

## 2014-07-23 NOTE — Progress Notes (Signed)
Subjective:    Patient ID: Briana Walter, female    DOB: 01/15/1961, 54 y.o.   MRN: [id number]  HPI  54YO female presents for follow up.  Diarrhea - Improved today. Tried eating solid foods, chicken sandwich, and had diarrhea after earlier this week. At baseline, has diarrhea 5-6 times per day with urgency that limits her ability to work. Recent colonoscopy was normal. Denies abdominal pain, except for cramping prior to episodes. Denies fever, chills. Denies blood in stool.  Wt Readings from Last 3 Encounters:  07/23/14 239 lb 4 oz (108.523 kg)  07/16/14 236 lb 8 oz (107.276 kg)  04/17/14 245 lb 8 oz (111.358 kg)     Past medical, surgical, family and social history per today's encounter.  Review of Systems  Constitutional: Negative for fever, chills, appetite change, fatigue and unexpected weight change.  Eyes: Negative for visual disturbance.  Respiratory: Negative for shortness of breath.   Cardiovascular: Negative for chest pain and leg swelling.  Gastrointestinal: Positive for diarrhea. Negative for nausea, vomiting, abdominal pain, constipation, blood in stool and abdominal distention.  Skin: Negative for color change and rash.  Hematological: Negative for adenopathy. Does not bruise/bleed easily.  Psychiatric/Behavioral: Negative for dysphoric mood. The patient is not nervous/anxious.        Objective:    BP 132/81 mmHg  Pulse 77  Temp(Src) 97.8 F (36.6 C) (Oral)  Ht 5' 5.25" (1.657 m)  Wt 239 lb 4 oz (108.523 kg)  BMI 39.53 kg/m2  SpO2 96% Physical Exam  Constitutional: She is oriented to person, place, and time. She appears well-developed and well-nourished. No distress.  HENT:  Head: Normocephalic and atraumatic.  Right Ear: External ear normal.  Left Ear: External ear normal.  Nose: Nose normal.  Mouth/Throat: Oropharynx is clear and moist. No oropharyngeal exudate.  Eyes: Conjunctivae are normal. Pupils are equal, round, and reactive to light.  Right eye exhibits no discharge. Left eye exhibits no discharge. No scleral icterus.  Neck: Normal range of motion. Neck supple. No tracheal deviation present. No thyromegaly present.  Cardiovascular: Normal rate, regular rhythm, normal heart sounds and intact distal pulses.  Exam reveals no gallop and no friction rub.   No murmur heard. Pulmonary/Chest: Effort normal and breath sounds normal. No respiratory distress. She has no wheezes. She has no rales. She exhibits no tenderness.  Abdominal: Soft. Bowel sounds are normal. She exhibits no distension. There is no tenderness.  Musculoskeletal: Normal range of motion. She exhibits no edema or tenderness.  Lymphadenopathy:    She has no cervical adenopathy.  Neurological: She is alert and oriented to person, place, and time. No cranial nerve deficit. She exhibits normal muscle tone. Coordination normal.  Skin: Skin is warm and dry. No rash noted. She is not diaphoretic. No erythema. No pallor.  Psychiatric: She has a normal mood and affect. Her behavior is normal. Judgment and thought content normal.          Assessment & Plan:   Problem List Items Addressed This Visit      Unprioritized   Diarrhea - Primary    Diarrhea has improved somewhat, now back at baseline, however still significant diarrhea 5-6 episodes per day. She does not want to follow up with GI given cost of visit. Recommended FODMAP diet. Discussed potentially starting Xifaxan and using anti-diarrheal medications. Follow up in 2 weeks.      Elevated LFTs    Mild recent elevation of LFTs. Likely viral infection. Will repeat today.  If persistent elevation, plan to check RUQ US.      Relevant Orders   Comprehensive metabolic panel       Return in about 2 weeks (around 08/06/2014).

## 2014-07-23 NOTE — Patient Instructions (Addendum)
Start FODMAP diet. Consider adding Align probiotic.  We will consider adding Xifaxin in the future if symptoms persist.  Labs today.

## 2014-07-24 LAB — COMPREHENSIVE METABOLIC PANEL
ALBUMIN: 4.2 g/dL (ref 3.5–5.5)
ALT: 54 IU/L — ABNORMAL HIGH (ref 0–32)
AST: 33 IU/L (ref 0–40)
Albumin/Globulin Ratio: 1.5 (ref 1.1–2.5)
Alkaline Phosphatase: 75 IU/L (ref 39–117)
BILIRUBIN TOTAL: 0.6 mg/dL (ref 0.0–1.2)
BUN / CREAT RATIO: 13 (ref 9–23)
BUN: 10 mg/dL (ref 6–24)
CO2: 20 mmol/L (ref 18–29)
Calcium: 9.1 mg/dL (ref 8.7–10.2)
Chloride: 102 mmol/L (ref 97–108)
Creatinine, Ser: 0.79 mg/dL (ref 0.57–1.00)
GFR calc Af Amer: 100 mL/min/{1.73_m2} (ref >59)
GFR calc non Af Amer: 86 mL/min/{1.73_m2} (ref >59)
GLOBULIN, TOTAL: 2.8 g/dL (ref 1.5–4.5)
GLUCOSE: 95 mg/dL (ref 65–99)
Potassium: 3.6 mmol/L (ref 3.5–5.2)
Sodium: 139 mmol/L (ref 134–144)
Total Protein: 7 g/dL (ref 6.0–8.5)

## 2014-08-11 ENCOUNTER — Ambulatory Visit: Payer: 59 | Admitting: Internal Medicine

## 2014-09-23 ENCOUNTER — Ambulatory Visit (INDEPENDENT_AMBULATORY_CARE_PROVIDER_SITE_OTHER): Payer: 59 | Admitting: Internal Medicine

## 2014-09-23 ENCOUNTER — Encounter: Payer: Self-pay | Admitting: Internal Medicine

## 2014-09-23 VITALS — BP 122/91 | HR 77 | Temp 97.9°F | Ht 65.25 in | Wt 240.5 lb

## 2014-09-23 DIAGNOSIS — Z720 Tobacco use: Secondary | ICD-10-CM | POA: Diagnosis not present

## 2014-09-23 DIAGNOSIS — E669 Obesity, unspecified: Secondary | ICD-10-CM | POA: Insufficient documentation

## 2014-09-23 DIAGNOSIS — F17200 Nicotine dependence, unspecified, uncomplicated: Secondary | ICD-10-CM

## 2014-09-23 DIAGNOSIS — M25561 Pain in right knee: Secondary | ICD-10-CM

## 2014-09-23 DIAGNOSIS — M25569 Pain in unspecified knee: Secondary | ICD-10-CM | POA: Insufficient documentation

## 2014-09-23 MED ORDER — BUPROPION HCL ER (XL) 150 MG PO TB24: 150.0000 mg | ORAL_TABLET | Freq: Every day | ORAL | Status: DC

## 2014-09-23 NOTE — Assessment & Plan Note (Signed)
Wt Readings from Last 3 Encounters:  09/23/14 240 lb 8 oz (109.09 kg)  07/23/14 239 lb 4 oz (108.523 kg)  07/16/14 236 lb 8 oz (107.276 kg)  Body mass index is 39.73 kg/(m^2). Encouraged healthy diet and exercise.

## 2014-09-23 NOTE — Patient Instructions (Addendum)
Start Wellbutrin  daily to help with smoking cessation.  Use Ibuprofen up to  three times daily for knee pain.  Call if knee pain is persistent or worsening.

## 2014-09-23 NOTE — Assessment & Plan Note (Signed)
Right knee pain, improving. Exam normal. Symptoms are most consistent with infrapatellar bursitis. Will monitor for now. Start prn Ibuprofen. Follow up in 4 weeks or sooner as needed.

## 2014-09-23 NOTE — Progress Notes (Signed)
Subjective:    Patient ID: Briana Walter, female    DOB: 01/22/1961, 54 y.o.   MRN: [id number]  HPI  54YO female presents for acute visit.  Right knee pain - Started about 1.5 months ago.  Noticed pain, walking up stairs. No grinding noted. Initially was swollen and had evaluation at urgent care for DVT, which was negative. No trauma to knee Made appointment with ortho, but was concerned about cost.   Started Weight Watchers. Trying to lose weight.  Wt Readings from Last 3 Encounters:  09/23/14 240 lb 8 oz (109.09 kg)  07/23/14 239 lb 4 oz (108.523 kg)  07/16/14 236 lb 8 oz (107.276 kg)   Past medical, surgical, family and social history per today's encounter.  Review of Systems  Constitutional: Negative for fever, chills, appetite change, fatigue and unexpected weight change.  Eyes: Negative for visual disturbance.  Respiratory: Negative for shortness of breath.   Cardiovascular: Negative for chest pain and leg swelling.  Gastrointestinal: Negative for nausea, vomiting, abdominal pain, diarrhea and constipation.  Musculoskeletal: Positive for myalgias and arthralgias.  Skin: Negative for color change and rash.  Hematological: Negative for adenopathy. Does not bruise/bleed easily.  Psychiatric/Behavioral: Negative for dysphoric mood. The patient is not nervous/anxious.        Objective:    BP 122/91 mmHg  Pulse 77  Temp(Src) 97.9 F (36.6 C) (Oral)  Ht 5' 5.25" (1.657 m)  Wt 240 lb 8 oz (109.09 kg)  BMI 39.73 kg/m2  SpO2 98% Physical Exam  Constitutional: She is oriented to person, place, and time. She appears well-developed and well-nourished. No distress.  HENT:  Head: Normocephalic and atraumatic.  Right Ear: External ear normal.  Left Ear: External ear normal.  Nose: Nose normal.  Mouth/Throat: Oropharynx is clear and moist.  Eyes: Conjunctivae are normal. Pupils are equal, round, and reactive to light. Right eye exhibits no discharge. Left eye exhibits no  discharge. No scleral icterus.  Neck: Normal range of motion. Neck supple. No tracheal deviation present. No thyromegaly present.  Pulmonary/Chest: Effort normal. No accessory muscle usage. No tachypnea. She has no decreased breath sounds. She has no rhonchi.  Musculoskeletal: Normal range of motion. She exhibits no edema or tenderness.       Right knee: She exhibits normal range of motion, no swelling and no effusion. No tenderness found.  Lymphadenopathy:    She has no cervical adenopathy.  Neurological: She is alert and oriented to person, place, and time. No cranial nerve deficit. She exhibits normal muscle tone. Coordination normal.  Skin: Skin is warm and dry. No rash noted. She is not diaphoretic. No erythema. No pallor.  Psychiatric: She has a normal mood and affect. Her behavior is normal. Judgment and thought content normal.          Assessment & Plan:   Problem List Items Addressed This Visit      Unprioritized   Knee pain - Primary    Right knee pain, improving. Exam normal. Symptoms are most consistent with infrapatellar bursitis. Will monitor for now. Start prn Ibuprofen. Follow up in 4 weeks or sooner as needed.      Obesity (BMI 30-39.9)    Wt Readings from Last 3 Encounters:  09/23/14 240 lb 8 oz (109.09 kg)  07/23/14 239 lb 4 oz (108.523 kg)  07/16/14 236 lb 8 oz (107.276 kg)  Body mass index is 39.73 kg/(m^2). Encouraged healthy diet and exercise.      Tobacco use disorder  Recommended smoking cessation. Discussed medications to help with this. Will start Wellbutrin  daily. Follow up in 4 weeks and prn.          Return in about 4 weeks (around 10/21/2014) for Recheck.

## 2014-09-23 NOTE — Progress Notes (Signed)
Pre visit review using our clinic review tool, if applicable. No additional management support is needed unless otherwise documented below in the visit note. 

## 2014-09-23 NOTE — Assessment & Plan Note (Signed)
Recommended smoking cessation. Discussed medications to help with this. Will start Wellbutrin  daily. Follow up in 4 weeks and prn.

## 2014-10-21 ENCOUNTER — Encounter: Payer: Self-pay | Admitting: Internal Medicine

## 2014-10-21 ENCOUNTER — Ambulatory Visit (INDEPENDENT_AMBULATORY_CARE_PROVIDER_SITE_OTHER): Payer: 59 | Admitting: Internal Medicine

## 2014-10-21 VITALS — BP 130/72 | HR 104 | Temp 98.2°F | Wt 231.0 lb

## 2014-10-21 DIAGNOSIS — E669 Obesity, unspecified: Secondary | ICD-10-CM | POA: Diagnosis not present

## 2014-10-21 DIAGNOSIS — F17200 Nicotine dependence, unspecified, uncomplicated: Secondary | ICD-10-CM

## 2014-10-21 DIAGNOSIS — Z720 Tobacco use: Secondary | ICD-10-CM

## 2014-10-21 MED ORDER — BUPROPION HCL ER (XL) 300 MG PO TB24: 300.0000 mg | ORAL_TABLET | Freq: Every day | ORAL | Status: DC

## 2014-10-21 NOTE — Progress Notes (Signed)
Pre visit review using our clinic review tool, if applicable. No additional management support is needed unless otherwise documented below in the visit note. 
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
## 2014-10-21 NOTE — Assessment & Plan Note (Signed)
Encouraged smoking cessation. Will increase wellbutrin to  daily to help with cravings.

## 2014-10-21 NOTE — Patient Instructions (Signed)
Increase Wellbutrin to  daily.  Continue efforts at healthy diet and exercise.  Follow up in 3 months.

## 2014-10-21 NOTE — Assessment & Plan Note (Signed)
Wt Readings from Last 3 Encounters:  10/21/14 231 lb (104.781 kg)  09/23/14 240 lb 8 oz (109.09 kg)  07/23/14 239 lb 4 oz (108.523 kg)   Congratulated pt on weight loss. Will increase Wellbutrin to  daily to help with appetite. Continue Weight Watchers. Continue exercise. Follow up 3 months.

## 2014-11-30 IMAGING — CT CT ABD-PELV W/ CM
1 of 2 series · 15 of 32 positions shown, 19 images · non-contrast
Comparison: none

REASON FOR EXAM: Bloody stool Diarrhea Abd pain
COMMENTS:

[Series 2: soft tissue · axial · 0.70mm/px · z∈[-884,-402]mm · 15 of 175 slices shown, 19 images]
[im 7/175  soft-tissue]
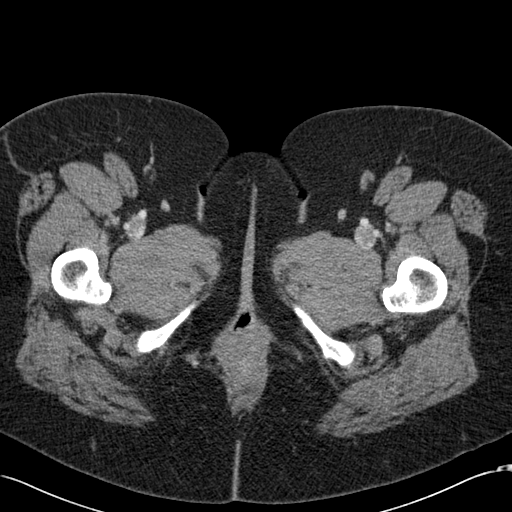
[im 7/175  bone]
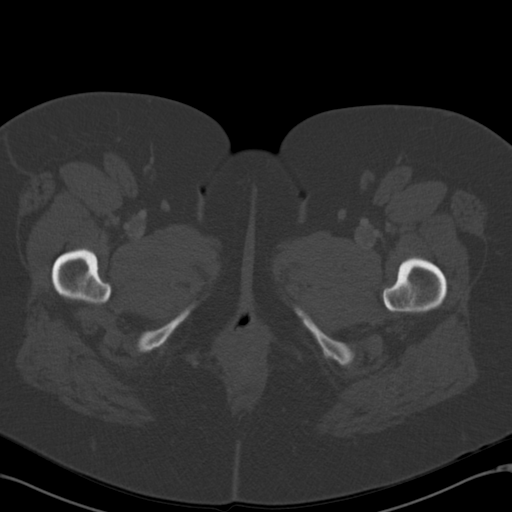
[im 21/175  soft-tissue]
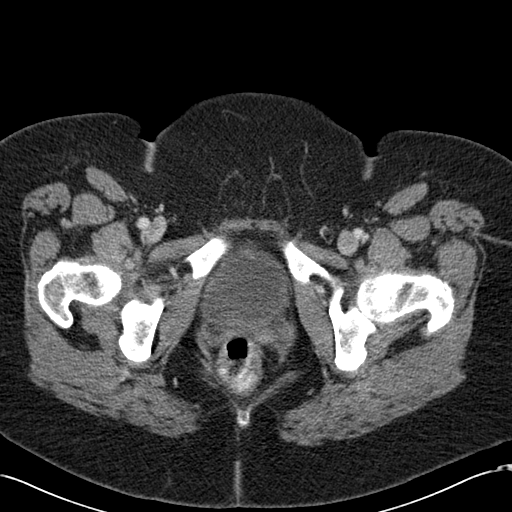
[im 35/175  soft-tissue]
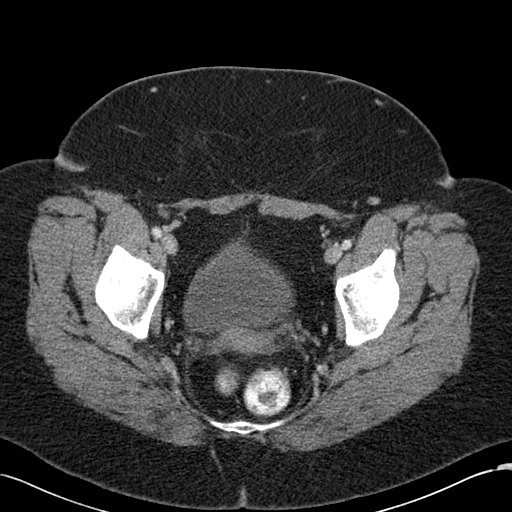
[im 49/175  soft-tissue]
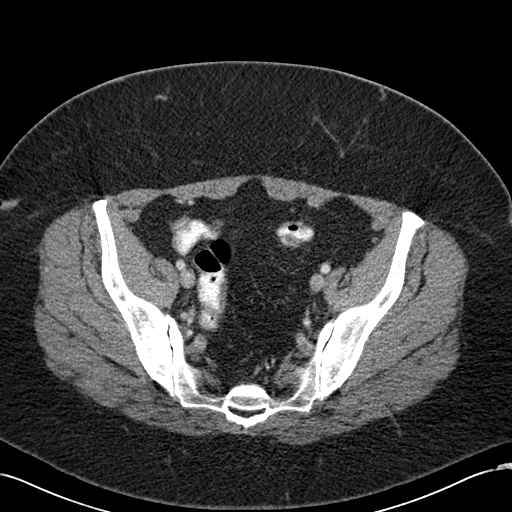
[im 63/175  soft-tissue]
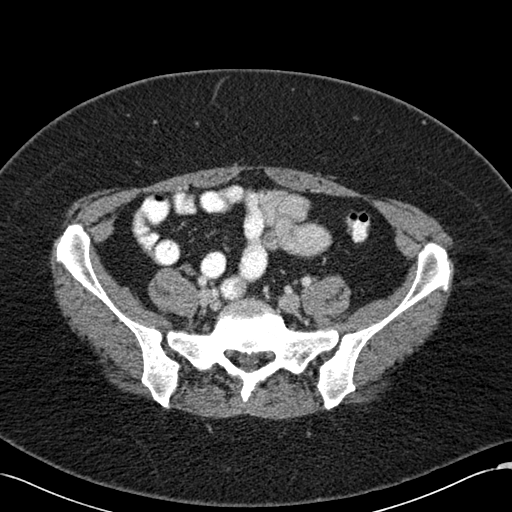
[im 77/175  soft-tissue]
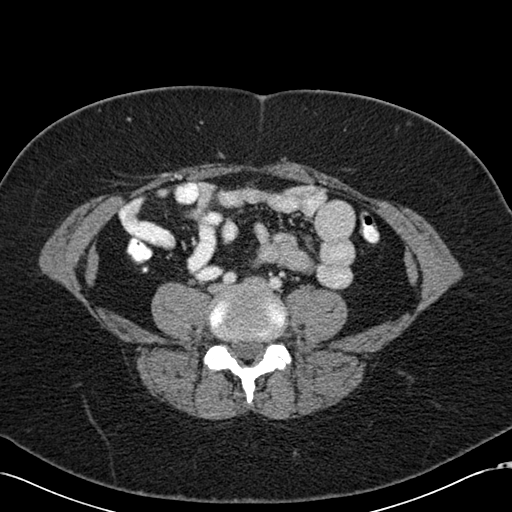
[im 91/175  soft-tissue]
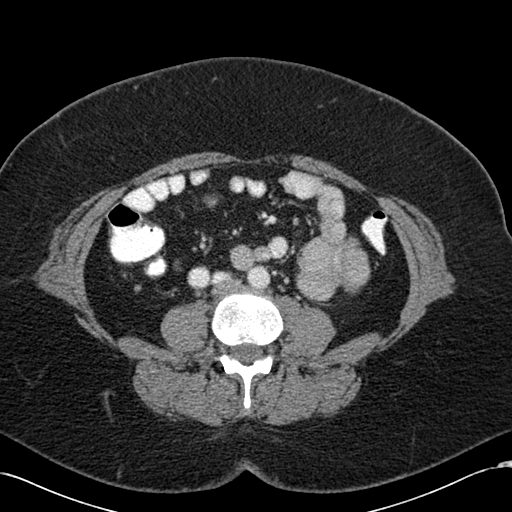
[im 98/175  soft-tissue]
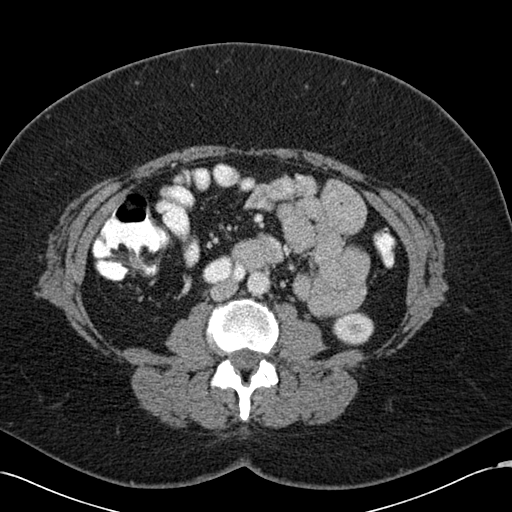
[im 112/175  soft-tissue]
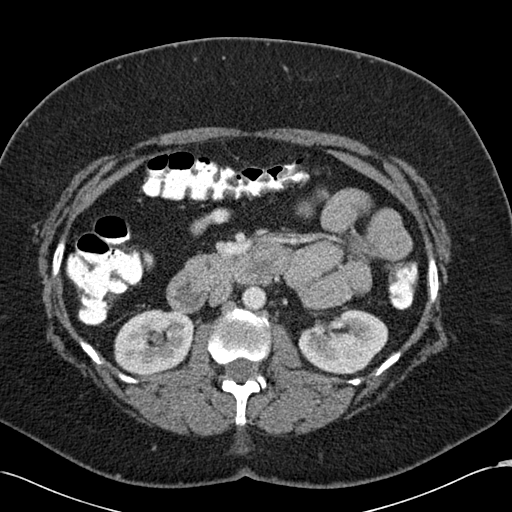
[im 112/175  bone]
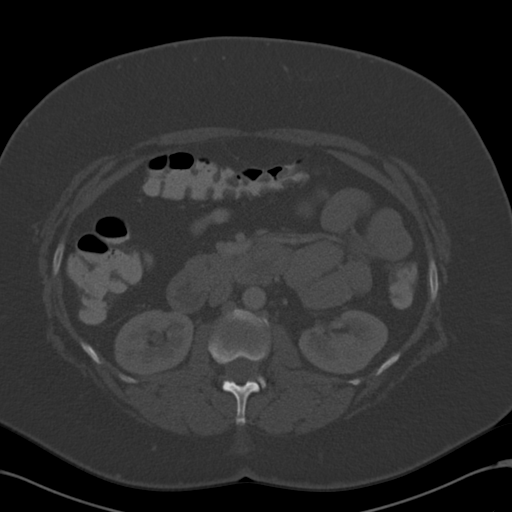
[im 126/175  soft-tissue]
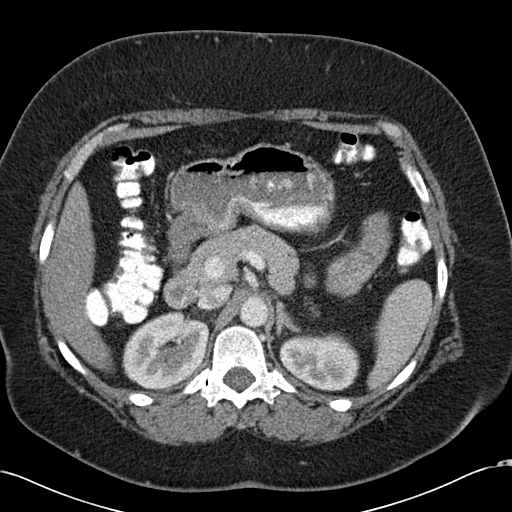
[im 140/175  soft-tissue]
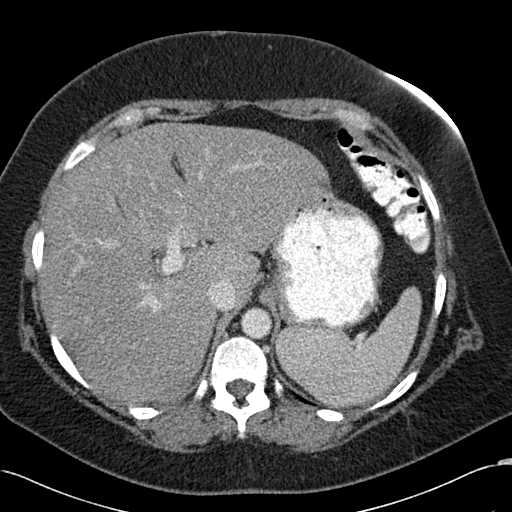
[im 147/175  lung]
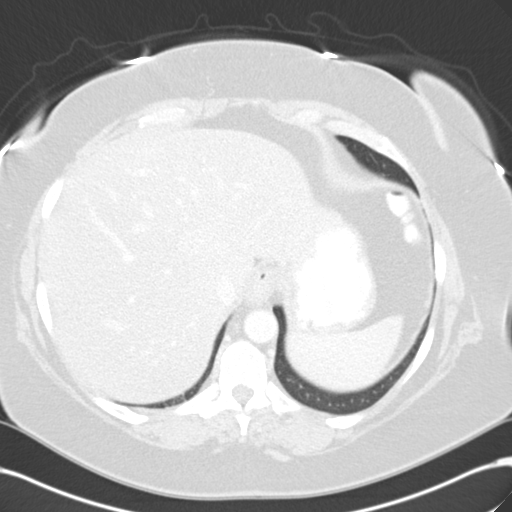
[im 154/175  soft-tissue]
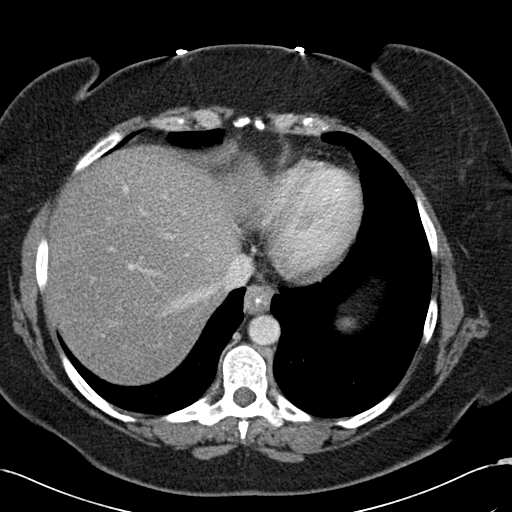
[im 154/175  lung]
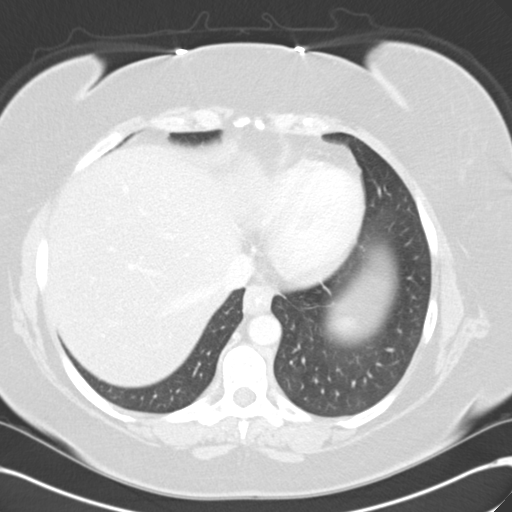
[im 161/175  lung]
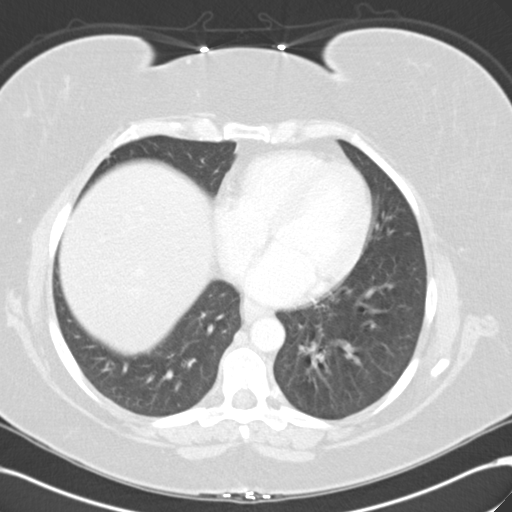
[im 168/175  soft-tissue]
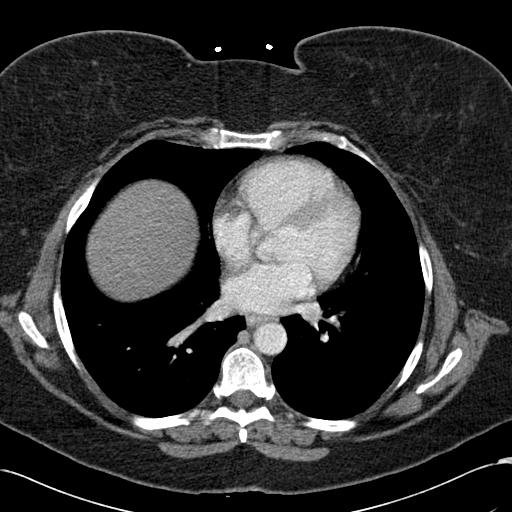
[im 168/175  lung]
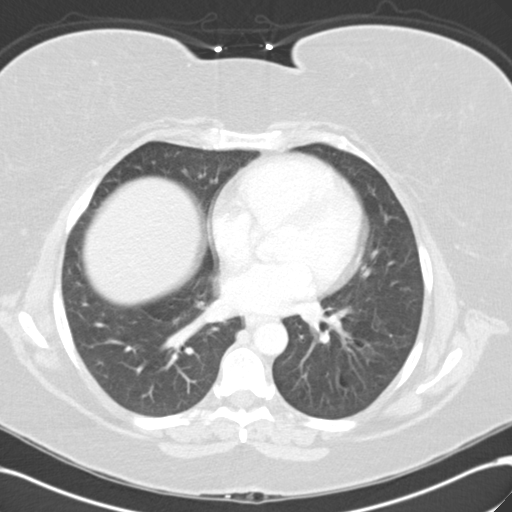

[15 of 32 positions shown; findings below may reference images not displayed]

PROCEDURE:     CT  - CT ABDOMEN / PELVIS  W  - June 21, 2012  [DATE]

RESULT:     CT scan of the abdomen and pelvis is performed with 100 mL of
5sovue-NCC iodinated intravenous contrast and oral contrast with images
reconstructed at 3 mm slice thickness in the axial plane. The patient has no
previous exam for comparison.

Images through the lung bases demonstrate no discrete mass, infiltrate,
effusion, pneumothorax or evidence of cardiomegaly. The liver enhances
homogeneously without a discrete mass. The spleen is normal in size and
attenuation. The adrenal glands are normal. The kidneys show no obstruction
or solid or cystic mass. No nephrolithiasis is evident. No radiopaque
gallstones are demonstrated. The stomach is unremarkable. The small bowel
and colon appear within normal limits. The abdominal aorta is normal in
caliber. There is no adenopathy. There is a normal appearing appendix
present. There is no evidence of significant colonic diverticulosis. Minimal
diverticulosis is seen in the sigmoid colon proximally. There is no evidence
of acute infectious or inflammatory process. It appears the patient is
status post hysterectomy. The urinary bladder is unremarkable. No adenopathy
is evident.
IMPRESSION: 1. No acute abnormality evident in the abdomen or pelvis. The colon shows no
evidence of abnormal distention or wall thickening. There is no acute
inflammatory process. There is no adenopathy or mass evident.

[REDACTED]

## 2015-01-22 ENCOUNTER — Ambulatory Visit: Payer: 59 | Admitting: Internal Medicine

## 2015-02-08 ENCOUNTER — Ambulatory Visit: Payer: 59 | Admitting: Internal Medicine

## 2015-11-25 ENCOUNTER — Telehealth: Payer: Self-pay | Admitting: Internal Medicine

## 2015-11-25 NOTE — Telephone Encounter (Signed)
Left pt a vm to call office to sch flu shot. °

## 2015-12-07 ENCOUNTER — Other Ambulatory Visit: Payer: Self-pay

## 2015-12-07 MED ORDER — BUPROPION HCL ER (XL) 300 MG PO TB24: 300.0000 mg | ORAL_TABLET | Freq: Every day | ORAL | 3 refills | Status: DC

## 2015-12-07 NOTE — Telephone Encounter (Signed)
Please advise for refill, last seen in 2016.  Thanks

## 2017-07-02 ENCOUNTER — Telehealth: Payer: Self-pay

## 2017-07-02 NOTE — Telephone Encounter (Signed)
Copied from CRM [id number]. Topic: Inquiry >> Jul 02, 2017 10:42 AM Raquel Sarna wrote: Pt was a former pt of Dr. Dan Humphreys.  She was last seen 10-21-14.  She is wanting to transfer her care to [facility]. Please call pt back to let her know and to sahcedule an appt

## 2017-07-03 NOTE — Telephone Encounter (Signed)
?  ok to schedule as new patient

## 2017-07-04 NOTE — Telephone Encounter (Signed)
yes

## 2017-07-05 NOTE — Telephone Encounter (Signed)
Left voice mail to call back ok for [facility] to speak to patient, ok to schedule new patient appointment

## 2018-02-04 ENCOUNTER — Encounter

## 2018-02-04 ENCOUNTER — Ambulatory Visit: Payer: 59 | Admitting: Family

## 2018-02-04 ENCOUNTER — Encounter: Payer: Self-pay | Admitting: Family

## 2018-02-04 VITALS — BP 118/76 | HR 86 | Wt 233.0 lb

## 2018-02-04 DIAGNOSIS — G4730 Sleep apnea, unspecified: Secondary | ICD-10-CM | POA: Diagnosis not present

## 2018-02-04 DIAGNOSIS — Z1239 Encounter for other screening for malignant neoplasm of breast: Secondary | ICD-10-CM | POA: Diagnosis not present

## 2018-02-04 DIAGNOSIS — F17200 Nicotine dependence, unspecified, uncomplicated: Secondary | ICD-10-CM

## 2018-02-04 DIAGNOSIS — Z0000 Encounter for general adult medical examination without abnormal findings: Secondary | ICD-10-CM

## 2018-02-04 MED ORDER — BUPROPION HCL ER (XL) 150 MG PO TB24: ORAL_TABLET | ORAL | 3 refills | Status: DC

## 2018-02-04 NOTE — Assessment & Plan Note (Signed)
She has desire to cut back on smoking, does not desire to quit altogether.  She states Wellbutrin has helped her to do this in the past.  Trial of Wellbutrin.  Follow-up 6 months.
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
## 2018-02-04 NOTE — Assessment & Plan Note (Signed)
Patient will return for cpe

## 2018-02-04 NOTE — Assessment & Plan Note (Signed)
Ordered.  Patient understands to schedule.  She is very overdue, very much encouraged her to schedule.

## 2018-02-04 NOTE — Assessment & Plan Note (Signed)
Strongly encouraged patient to use FSA funds to be tested for sleep apnea.  Advised her of the risk of untreated sleep apnea.  Patient politely declines sleep apnea study.  Will discuss at future visits.

## 2018-02-04 NOTE — Patient Instructions (Addendum)
Information for medical power of attorney, living will provided  Start wellbutrin; titrate up as prescription states  We placed a referral for mammogram this year. I asked that you call one the below locations and schedule this when it is convenient for you.   As discussed, I would like you to ask for 3D mammogram over the traditional 2D mammogram as new evidence suggest 3D is superior.   Please note that NOT all insurance companies cover 3D and you may have to pay a higher copay. You may call your insurance company to further clarify your benefits.   Options for Mammogram.    [facility]  [address]  [place], [place]  [phone]  * [facility]*   [facility] [address] [place], [place] [phone] * Note if you ask for 3D mammogram at this location, you must request [place], [place] location*

## 2018-02-05 ENCOUNTER — Other Ambulatory Visit: Payer: Self-pay | Admitting: Family

## 2018-02-05 DIAGNOSIS — E559 Vitamin D deficiency, unspecified: Secondary | ICD-10-CM

## 2018-02-05 LAB — TSH: TSH: 1.49 u[IU]/mL (ref 0.450–4.500)

## 2018-02-05 LAB — COMPREHENSIVE METABOLIC PANEL
ALK PHOS: 75 IU/L (ref 39–117)
ALT: 18 IU/L (ref 0–32)
AST: 14 IU/L (ref 0–40)
Albumin/Globulin Ratio: 2.1 (ref 1.2–2.2)
Albumin: 4.4 g/dL (ref 3.5–5.5)
BUN/Creatinine Ratio: 16 (ref 9–23)
BUN: 12 mg/dL (ref 6–24)
Bilirubin Total: 0.5 mg/dL (ref 0.0–1.2)
CALCIUM: 9.6 mg/dL (ref 8.7–10.2)
CO2: 23 mmol/L (ref 20–29)
Chloride: 102 mmol/L (ref 96–106)
Creatinine, Ser: 0.73 mg/dL (ref 0.57–1.00)
GFR calc Af Amer: 107 mL/min/{1.73_m2} (ref >59)
GFR, EST NON AFRICAN AMERICAN: 93 mL/min/{1.73_m2} (ref >59)
GLOBULIN, TOTAL: 2.1 g/dL (ref 1.5–4.5)
GLUCOSE: 102 mg/dL — AB (ref 65–99)
POTASSIUM: 4.4 mmol/L (ref 3.5–5.2)
Sodium: 141 mmol/L (ref 134–144)
TOTAL PROTEIN: 6.5 g/dL (ref 6.0–8.5)

## 2018-02-05 LAB — CBC WITH DIFFERENTIAL/PLATELET
BASOS ABS: 0 10*3/uL (ref 0.0–0.2)
BASOS: 1 %
EOS (ABSOLUTE): 0.1 10*3/uL (ref 0.0–0.4)
Eos: 2 %
HEMOGLOBIN: 14.2 g/dL (ref 11.1–15.9)
Hematocrit: 40.2 % (ref 34.0–46.6)
IMMATURE GRANS (ABS): 0 10*3/uL (ref 0.0–0.1)
Immature Granulocytes: 0 %
Lymphocytes Absolute: 1.4 10*3/uL (ref 0.7–3.1)
Lymphs: 30 %
MCH: 30.3 pg (ref 26.6–33.0)
MCHC: 35.3 g/dL (ref 31.5–35.7)
MCV: 86 fL (ref 79–97)
MONOCYTES: 7 %
Monocytes Absolute: 0.4 10*3/uL (ref 0.1–0.9)
NEUTROS ABS: 2.9 10*3/uL (ref 1.4–7.0)
Neutrophils: 60 %
PLATELETS: 360 10*3/uL (ref 150–450)
RBC: 4.68 x10E6/uL (ref 3.77–5.28)
RDW: 12.3 % (ref 12.3–15.4)
WBC: 4.8 10*3/uL (ref 3.4–10.8)

## 2018-02-05 LAB — LIPID PANEL
Chol/HDL Ratio: 4.3 ratio (ref 0.0–4.4)
Cholesterol, Total: 213 mg/dL — ABNORMAL HIGH (ref 100–199)
HDL: 50 mg/dL (ref >39)
LDL Calculated: 139 mg/dL — ABNORMAL HIGH (ref 0–99)
Triglycerides: 121 mg/dL (ref 0–149)
VLDL Cholesterol Cal: 24 mg/dL (ref 5–40)

## 2018-02-05 LAB — HEMOGLOBIN A1C
Est. average glucose Bld gHb Est-mCnc: 94 mg/dL
Hgb A1c MFr Bld: 4.9 % (ref 4.8–5.6)

## 2018-02-05 LAB — VITAMIN D 25 HYDROXY (VIT D DEFICIENCY, FRACTURES): Vit D, 25-Hydroxy: 9.8 ng/mL — ABNORMAL LOW (ref 30.0–100.0)

## 2018-02-05 MED ORDER — CHOLECALCIFEROL 1.25 MG (50000 UT) PO TABS: ORAL_TABLET | ORAL | 0 refills | Status: DC

## 2018-03-08 ENCOUNTER — Encounter: Payer: Managed Care, Other (non HMO) | Admitting: Family

## 2018-03-15 ENCOUNTER — Ambulatory Visit (INDEPENDENT_AMBULATORY_CARE_PROVIDER_SITE_OTHER): Payer: Managed Care, Other (non HMO) | Admitting: Family

## 2018-03-15 ENCOUNTER — Other Ambulatory Visit: Payer: Self-pay

## 2018-03-15 ENCOUNTER — Encounter: Payer: Self-pay | Admitting: Family

## 2018-03-15 ENCOUNTER — Other Ambulatory Visit (HOSPITAL_COMMUNITY)
Admission: RE | Admit: 2018-03-15 | Discharge: 2018-03-15 | Disposition: A | Discharge status: Home / Self Care | Payer: Managed Care, Other (non HMO) | Source: Ambulatory Visit | Attending: Family | Admitting: Family

## 2018-03-15 VITALS — BP 136/82 | HR 83 | Temp 98.4°F | Wt 228.2 lb

## 2018-03-15 DIAGNOSIS — Z66 Do not resuscitate: Secondary | ICD-10-CM | POA: Diagnosis not present

## 2018-03-15 DIAGNOSIS — Z122 Encounter for screening for malignant neoplasm of respiratory organs: Secondary | ICD-10-CM | POA: Diagnosis not present

## 2018-03-15 DIAGNOSIS — Z1239 Encounter for other screening for malignant neoplasm of breast: Secondary | ICD-10-CM

## 2018-03-15 DIAGNOSIS — F17200 Nicotine dependence, unspecified, uncomplicated: Secondary | ICD-10-CM | POA: Diagnosis not present

## 2018-03-15 DIAGNOSIS — Z0000 Encounter for general adult medical examination without abnormal findings: Secondary | ICD-10-CM | POA: Insufficient documentation

## 2018-03-15 MED ORDER — BUPROPION HCL ER (XL) 150 MG PO TB24: ORAL_TABLET | ORAL | 1 refills | Status: DC

## 2018-03-15 NOTE — Patient Instructions (Addendum)
Come back in 6 months for appt and we can check you cholesterol.   Monitor blood pressure,  Goal is less than 120/80, based on newest guidelines; if persistently higher, please make sooner follow up appointment so we can recheck you blood pressure and manage medications  Please hold me accountable in regards to the DNR order and make sure that your chart accurately reflects that on Epic. We have the order however it is NOT scanned in correct place.   We placed a referral for mammogram this year. I asked that you call one the below locations and schedule this when it is convenient for you.   As discussed, I would like you to ask for 3D mammogram over the traditional 2D mammogram as new evidence suggest 3D is superior.   Please note that NOT all insurance companies cover 3D and you may have to pay a higher copay. You may call your insurance company to further clarify your benefits.   Options for [place]  [place], [place]  * [facility] [place], [place] * Note if you ask for 3D mammogram at this location, you must request [place], [place] location*    Health Maintenance for Postmenopausal Women Menopause is a normal process in which your reproductive ability comes to an end. This process happens gradually over a span of months to years, usually between the ages of 56 and 80. Menopause is complete when you have missed 12 consecutive menstrual periods. It is important to talk with your health care provider about some of the most common conditions that affect postmenopausal women, such as heart disease, cancer, and bone loss (osteoporosis). Adopting a healthy lifestyle and getting preventive care can help to promote your health and wellness. Those actions can also lower your chances of developing some of these common conditions. What should I  know about menopause? During menopause, you may experience a number of symptoms, such as:  Moderate-to-severe hot flashes.  Night sweats.  Decrease in sex drive.  Mood swings.  Headaches.  Tiredness.  Irritability.  Memory problems.  Insomnia. Choosing to treat or not to treat menopausal changes is an individual decision that you make with your health care provider. What should I know about hormone replacement therapy and supplements? Hormone therapy products are effective for treating symptoms that are associated with menopause, such as hot flashes and night sweats. Hormone replacement carries certain risks, especially as you become older. If you are thinking about using estrogen or estrogen with progestin treatments, discuss the benefits and risks with your health care provider. What should I know about heart disease and stroke? Heart disease, heart attack, and stroke become more likely as you age. This may be due, in part, to the hormonal changes that your body experiences during menopause. These can affect how your body processes dietary fats, triglycerides, and cholesterol. Heart attack and stroke are both medical emergencies. There are many things that you can do to help prevent heart disease and stroke:  Have your blood pressure checked at least every 1-2 years. High blood pressure causes heart disease and increases the risk of stroke.  If you are 94-82 years old, ask your health care provider if you should take aspirin to prevent a heart attack or a stroke.  Do not use any tobacco products, including cigarettes, chewing tobacco, or electronic cigarettes. If you need  help quitting, ask your health care provider.  It is important to eat a healthy diet and maintain a healthy weight. ? Be sure to include plenty of vegetables, fruits, low-fat dairy products, and lean protein. ? Avoid eating foods that are high in solid fats, added sugars, or salt (sodium).  Get regular  exercise. This is one of the most important things that you can do for your health. ? Try to exercise for at least 150 minutes each week. The type of exercise that you do should increase your heart rate and make you sweat. This is known as moderate-intensity exercise. ? Try to do strengthening exercises at least twice each week. Do these in addition to the moderate-intensity exercise.  Know your numbers.Ask your health care provider to check your cholesterol and your blood glucose. Continue to have your blood tested as directed by your health care provider.  What should I know about cancer screening? There are several types of cancer. Take the following steps to reduce your risk and to catch any cancer development as early as possible. Breast Cancer  Practice breast self-awareness. ? This means understanding how your breasts normally appear and feel. ? It also means doing regular breast self-exams. Let your health care provider know about any changes, no matter how small.  If you are 63 or older, have a clinician do a breast exam (clinical breast exam or CBE) every year. Depending on your age, family history, and medical history, it may be recommended that you also have a yearly breast X-ray (mammogram).  If you have a family history of breast cancer, talk with your health care provider about genetic screening.  If you are at high risk for breast cancer, talk with your health care provider about having an MRI and a mammogram every year.  Breast cancer (BRCA) gene test is recommended for women who have family members with BRCA-related cancers. Results of the assessment will determine the need for genetic counseling and BRCA1 and for BRCA2 testing. BRCA-related cancers include these types: ? Breast. This occurs in males or females. ? Ovarian. ? Tubal. This may also be called fallopian tube cancer. ? Cancer of the abdominal or pelvic lining (peritoneal  cancer). ? Prostate. ? Pancreatic. Cervical, Uterine, and Ovarian Cancer Your health care provider may recommend that you be screened regularly for cancer of the pelvic organs. These include your ovaries, uterus, and vagina. This screening involves a pelvic exam, which includes checking for microscopic changes to the surface of your cervix (Pap test).  For women ages 21-65, health care providers may recommend a pelvic exam and a Pap test every three years. For women ages 48-65, they may recommend the Pap test and pelvic exam, combined with testing for human papilloma virus (HPV), every five years. Some types of HPV increase your risk of cervical cancer. Testing for HPV may also be done on women of any age who have unclear Pap test results.  Other health care providers may not recommend any screening for nonpregnant women who are considered low risk for pelvic cancer and have no symptoms. Ask your health care provider if a screening pelvic exam is right for you.  If you have had past treatment for cervical cancer or a condition that could lead to cancer, you need Pap tests and screening for cancer for at least 20 years after your treatment. If Pap tests have been discontinued for you, your risk factors (such as having a new sexual partner) need to be reassessed to  determine if you should start having screenings again. Some women have medical problems that increase the chance of getting cervical cancer. In these cases, your health care provider may recommend that you have screening and Pap tests more often.  If you have a family history of uterine cancer or ovarian cancer, talk with your health care provider about genetic screening.  If you have vaginal bleeding after reaching menopause, tell your health care provider.  There are currently no reliable tests available to screen for ovarian cancer. Lung Cancer Lung cancer screening is recommended for adults 71-14 years old who are at high risk for lung  cancer because of a history of smoking. A yearly low-dose CT scan of the lungs is recommended if you:  Currently smoke.  Have a history of at least 30 pack-years of smoking and you currently smoke or have quit within the past 15 years. A pack-year is smoking an average of one pack of cigarettes per day for one year. Yearly screening should:  Continue until it has been 15 years since you quit.  Stop if you develop a health problem that would prevent you from having lung cancer treatment. Colorectal Cancer  This type of cancer can be detected and can often be prevented.  Routine colorectal cancer screening usually begins at age 84 and continues through age 76.  If you have risk factors for colon cancer, your health care provider may recommend that you be screened at an earlier age.  If you have a family history of colorectal cancer, talk with your health care provider about genetic screening.  Your health care provider may also recommend using home test kits to check for hidden blood in your stool.  A small camera at the end of a tube can be used to examine your colon directly (sigmoidoscopy or colonoscopy). This is done to check for the earliest forms of colorectal cancer.  Direct examination of the colon should be repeated every 5-10 years until age 9. However, if early forms of precancerous polyps or small growths are found or if you have a family history or genetic risk for colorectal cancer, you may need to be screened more often. Skin Cancer  Check your skin from head to toe regularly.  Monitor any moles. Be sure to tell your health care provider: ? About any new moles or changes in moles, especially if there is a change in a mole's shape or color. ? If you have a mole that is larger than the size of a pencil eraser.  If any of your family members has a history of skin cancer, especially at a young age, talk with your health care provider about genetic screening.  Always use  sunscreen. Apply sunscreen liberally and repeatedly throughout the day.  Whenever you are outside, protect yourself by wearing long sleeves, pants, a wide-brimmed hat, and sunglasses. What should I know about osteoporosis? Osteoporosis is a condition in which bone destruction happens more quickly than new bone creation. After menopause, you may be at an increased risk for osteoporosis. To help prevent osteoporosis or the bone fractures that can happen because of osteoporosis, the following is recommended:  If you are 26-66 years old, get at least 1,000 mg of calcium and at least 600 mg of vitamin D per day.  If you are older than age 45 but younger than age 61, get at least 1,200 mg of calcium and at least 600 mg of vitamin D per day.  If you are older than  age 70, get at least 1,200 mg of calcium and at least 800 mg of vitamin D per day. Smoking and excessive alcohol intake increase the risk of osteoporosis. Eat foods that are rich in calcium and vitamin D, and do weight-bearing exercises several times each week as directed by your health care provider. What should I know about how menopause affects my mental health? Depression may occur at any age, but it is more common as you become older. Common symptoms of depression include:  Low or sad mood.  Changes in sleep patterns.  Changes in appetite or eating patterns.  Feeling an overall lack of motivation or enjoyment of activities that you previously enjoyed.  Frequent crying spells. Talk with your health care provider if you think that you are experiencing depression. What should I know about immunizations? It is important that you get and maintain your immunizations. These include:  Tetanus, diphtheria, and pertussis (Tdap) booster vaccine.  Influenza every year before the flu season begins.  Pneumonia vaccine.  Shingles vaccine. Your health care provider may also recommend other immunizations. This information is not intended  to replace advice given to you by your health care provider. Make sure you discuss any questions you have with your health care provider. Document Released: 04/14/2005 Document Revised: 09/10/2015 Document Reviewed: 11/24/2014 Elsevier Interactive Patient Education  2019 [employer].

## 2018-03-15 NOTE — Assessment & Plan Note (Addendum)
Wearing bracelet today. Added DNR order so chart is purple in color. Patient is working on completion of living will, Medical POA.
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
## 2018-03-18 NOTE — Assessment & Plan Note (Addendum)
Pap performed, clinical breast exam performed. consents to participate in CT lung cancer screening program.  Mammogram ordered and patient understands to schedule.  Declines Prevnar.

## 2018-03-18 NOTE — Assessment & Plan Note (Signed)
Congratulated patient as she is smoking less.  Will continue Wellbutrin for weight loss as well.

## 2018-03-19 LAB — CYTOLOGY - PAP: Diagnosis: NEGATIVE

## 2018-03-20 ENCOUNTER — Encounter: Payer: Self-pay | Admitting: Family

## 2018-03-20 ENCOUNTER — Telehealth: Payer: Self-pay | Admitting: *Deleted

## 2018-03-20 NOTE — Telephone Encounter (Signed)
Received referral for low dose lung cancer screening CT scan. Message left at phone number listed in EMR for patient to call me back to facilitate scheduling scan.  

## 2018-03-22 ENCOUNTER — Telehealth: Payer: Self-pay | Admitting: *Deleted

## 2018-03-22 NOTE — Telephone Encounter (Signed)
Received referral for low dose lung cancer screening CT scan. Message left at phone number listed in EMR for patient to call me back to facilitate scheduling scan.  

## 2018-03-25 ENCOUNTER — Telehealth: Payer: Self-pay | Admitting: *Deleted

## 2018-03-25 ENCOUNTER — Encounter: Payer: Self-pay | Admitting: *Deleted

## 2018-03-25 ENCOUNTER — Encounter: Payer: Self-pay | Admitting: Family

## 2018-03-25 NOTE — Telephone Encounter (Signed)
Received referral for low dose lung cancer screening CT scan. Message left at phone number listed in EMR for patient to call me back to facilitate scheduling scan.  

## 2018-04-05 ENCOUNTER — Telehealth: Payer: Self-pay | Admitting: *Deleted

## 2018-04-05 DIAGNOSIS — Z87891 Personal history of nicotine dependence: Secondary | ICD-10-CM

## 2018-04-05 DIAGNOSIS — Z122 Encounter for screening for malignant neoplasm of respiratory organs: Secondary | ICD-10-CM

## 2018-04-05 NOTE — Telephone Encounter (Signed)
Received referral for initial lung cancer screening scan. Contacted patient and obtained smoking history,(current, 30 pack year) as well as answering questions related to screening process. Patient denies signs of lung cancer such as weight loss or hemoptysis. Patient denies comorbidity that would prevent curative treatment if lung cancer were found. Patient is scheduled for shared decision making visit and CT scan on 04/18/18 at 330pm.

## 2018-04-15 ENCOUNTER — Other Ambulatory Visit: Payer: Self-pay | Admitting: Family

## 2018-04-15 DIAGNOSIS — Z1239 Encounter for other screening for malignant neoplasm of breast: Secondary | ICD-10-CM

## 2018-04-17 ENCOUNTER — Telehealth: Payer: Self-pay | Admitting: *Deleted

## 2018-04-17 NOTE — Telephone Encounter (Signed)
CALLED PT TO REMIND HER OF HER APPT FOR LDCT SCREENING ON 04-18-2018 @1530 , MESSAGE LEFT FOR PATIENT

## 2018-04-18 ENCOUNTER — Inpatient Hospital Stay: Payer: Managed Care, Other (non HMO) | Attending: Nurse Practitioner | Admitting: Oncology

## 2018-04-18 ENCOUNTER — Ambulatory Visit
Admission: RE | Admit: 2018-04-18 | Discharge: 2018-04-18 | Disposition: A | Discharge status: Home / Self Care | Payer: Managed Care, Other (non HMO) | Source: Ambulatory Visit | Attending: Nurse Practitioner | Admitting: Nurse Practitioner

## 2018-04-18 ENCOUNTER — Encounter: Payer: Self-pay | Admitting: Nurse Practitioner

## 2018-04-18 DIAGNOSIS — Z87891 Personal history of nicotine dependence: Secondary | ICD-10-CM | POA: Diagnosis present

## 2018-04-18 DIAGNOSIS — Z122 Encounter for screening for malignant neoplasm of respiratory organs: Secondary | ICD-10-CM | POA: Diagnosis present

## 2018-04-19 NOTE — Progress Notes (Signed)
In accordance with CMS guidelines, patient has met eligibility criteria including age, absence of signs or symptoms of lung cancer.  Social History   Tobacco Use  . Smoking status: Current Every Day Smoker    Packs/day: 1.00    Years: 30.00    Pack years: 30.00    Types: Cigarettes  . Smokeless tobacco: Never Used  Substance Use Topics  . Alcohol use: Yes    Comment: Rarely  . Drug use: No     A shared decision-making session was conducted prior to the performance of CT scan. This includes one or more decision aids, includes benefits and harms of screening, follow-up diagnostic testing, over-diagnosis, false positive rate, and total radiation exposure.  Counseling on the importance of adherence to annual lung cancer LDCT screening, impact of co-morbidities, and ability or willingness to undergo diagnosis and treatment is imperative for compliance of the program.  Counseling on the importance of continued smoking cessation for former smokers; the importance of smoking cessation for current smokers, and information about tobacco cessation interventions have been given to patient including [place] and [address] quit [place] programs.  Written order for lung cancer screening with LDCT has been given to the patient and any and all questions have been answered to the best of my abilities.   Yearly follow up will be coordinated by Burgess Estelle, Thoracic Navigator.  Faythe Casa, NP 04/19/2018 12:29 PM

## 2018-04-22 ENCOUNTER — Encounter: Payer: Self-pay | Admitting: *Deleted

## 2018-04-27 ENCOUNTER — Encounter: Payer: Self-pay | Admitting: *Deleted

## 2018-09-13 ENCOUNTER — Ambulatory Visit: Payer: Managed Care, Other (non HMO) | Admitting: Family

## 2019-04-09 ENCOUNTER — Telehealth: Payer: Self-pay

## 2019-04-09 NOTE — Telephone Encounter (Signed)
Left message for patient to notify them that it is time to schedule annual low dose lung cancer screening CT scan. Instructed patient to call back ([phone]) to verify information prior to the scan being scheduled.  

## 2019-04-24 ENCOUNTER — Telehealth: Payer: Self-pay | Admitting: *Deleted

## 2019-04-24 NOTE — Telephone Encounter (Signed)
(  04/24/2019) Left message for patient to notify them that it is time to schedule annual low dose lung cancer screening CT scan. Instructed patient to call back to verify information prior to the scan being scheduled SRW     

## 2019-05-29 ENCOUNTER — Telehealth: Payer: Self-pay

## 2019-05-29 NOTE — Telephone Encounter (Signed)
Message left notifying patient that it is time to schedule the low dose lung cancer screening CT scan.  Instructed patient to return call to Shawn Perkins at [phone] to verify information prior to CT scan being scheduled.    

## 2019-09-11 ENCOUNTER — Encounter: Payer: Self-pay | Admitting: *Deleted
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
## 2022-07-12 ENCOUNTER — Ambulatory Visit: Payer: Self-pay | Admitting: Physician Assistant

## 2022-07-28 ENCOUNTER — Telehealth: Payer: Self-pay | Admitting: Physician Assistant
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
## 2022-08-01 ENCOUNTER — Encounter: Payer: Self-pay | Admitting: Physician Assistant

## 2022-08-01 ENCOUNTER — Ambulatory Visit: Payer: Managed Care, Other (non HMO) | Admitting: Physician Assistant

## 2022-08-01 VITALS — BP 132/72 | HR 80 | Ht 65.0 in | Wt 237.0 lb

## 2022-08-01 DIAGNOSIS — F17200 Nicotine dependence, unspecified, uncomplicated: Secondary | ICD-10-CM | POA: Diagnosis not present

## 2022-08-01 DIAGNOSIS — E669 Obesity, unspecified: Secondary | ICD-10-CM | POA: Diagnosis not present

## 2022-08-01 DIAGNOSIS — R3915 Urgency of urination: Secondary | ICD-10-CM

## 2022-08-01 DIAGNOSIS — M67451 Ganglion, right hip: Secondary | ICD-10-CM | POA: Insufficient documentation

## 2022-08-01 DIAGNOSIS — R7989 Other specified abnormal findings of blood chemistry: Secondary | ICD-10-CM

## 2022-08-01 DIAGNOSIS — R350 Frequency of micturition: Secondary | ICD-10-CM | POA: Diagnosis not present

## 2022-08-01 DIAGNOSIS — Z0000 Encounter for general adult medical examination without abnormal findings: Secondary | ICD-10-CM

## 2022-08-01 DIAGNOSIS — R1909 Other intra-abdominal and pelvic swelling, mass and lump: Secondary | ICD-10-CM

## 2022-08-01 DIAGNOSIS — Z66 Do not resuscitate: Secondary | ICD-10-CM

## 2022-08-01 DIAGNOSIS — G4730 Sleep apnea, unspecified: Secondary | ICD-10-CM

## 2022-08-01 LAB — POCT URINALYSIS DIPSTICK
Bilirubin, UA: NEGATIVE
Glucose, UA: NEGATIVE
Ketones, UA: NEGATIVE
Nitrite, UA: NEGATIVE
Protein, UA: NEGATIVE
Spec Grav, UA: 1.025 (ref 1.010–1.025)
Urobilinogen, UA: 0.2 E.U./dL
pH, UA: 5 (ref 5.0–8.0)

## 2022-08-02 LAB — CBC WITH DIFFERENTIAL/PLATELET
Basophils Absolute: 0 10*3/uL (ref 0.0–0.2)
Basos: 0 %
EOS (ABSOLUTE): 0.1 10*3/uL (ref 0.0–0.4)
Eos: 1 %
Hematocrit: 44.9 % (ref 34.0–46.6)
Hemoglobin: 15.3 g/dL (ref 11.1–15.9)
Immature Grans (Abs): 0 10*3/uL (ref 0.0–0.1)
Immature Granulocytes: 0 %
Lymphocytes Absolute: 1.5 10*3/uL (ref 0.7–3.1)
Lymphs: 27 %
MCH: 30.3 pg (ref 26.6–33.0)
MCHC: 34.1 g/dL (ref 31.5–35.7)
MCV: 89 fL (ref 79–97)
Monocytes Absolute: 0.4 10*3/uL (ref 0.1–0.9)
Monocytes: 8 %
Neutrophils Absolute: 3.5 10*3/uL (ref 1.4–7.0)
Neutrophils: 64 %
Platelets: 342 10*3/uL (ref 150–450)
RBC: 5.05 x10E6/uL (ref 3.77–5.28)
RDW: 12.2 % (ref 11.7–15.4)
WBC: 5.5 10*3/uL (ref 3.4–10.8)

## 2022-08-02 LAB — LIPID PANEL
Chol/HDL Ratio: 4.9 ratio — ABNORMAL HIGH (ref 0.0–4.4)
Cholesterol, Total: 267 mg/dL — ABNORMAL HIGH (ref 100–199)
HDL: 55 mg/dL (ref >39)
LDL Chol Calc (NIH): 182 mg/dL — ABNORMAL HIGH (ref 0–99)
Triglycerides: 163 mg/dL — ABNORMAL HIGH (ref 0–149)
VLDL Cholesterol Cal: 30 mg/dL (ref 5–40)

## 2022-08-02 LAB — HEMOGLOBIN A1C
Est. average glucose Bld gHb Est-mCnc: 111 mg/dL
Hgb A1c MFr Bld: 5.5 % (ref 4.8–5.6)

## 2022-08-02 LAB — COMPREHENSIVE METABOLIC PANEL
ALT: 27 IU/L (ref 0–32)
AST: 23 IU/L (ref 0–40)
Albumin/Globulin Ratio: 1.9 (ref 1.2–2.2)
Albumin: 4.7 g/dL (ref 3.8–4.9)
Alkaline Phosphatase: 91 IU/L (ref 44–121)
BUN/Creatinine Ratio: 23 (ref 12–28)
BUN: 18 mg/dL (ref 8–27)
Bilirubin Total: 0.5 mg/dL (ref 0.0–1.2)
CO2: 18 mmol/L — ABNORMAL LOW (ref 20–29)
Calcium: 9.5 mg/dL (ref 8.7–10.3)
Chloride: 106 mmol/L (ref 96–106)
Creatinine, Ser: 0.77 mg/dL (ref 0.57–1.00)
Globulin, Total: 2.5 g/dL (ref 1.5–4.5)
Glucose: 108 mg/dL — ABNORMAL HIGH (ref 70–99)
Potassium: 4.5 mmol/L (ref 3.5–5.2)
Sodium: 142 mmol/L (ref 134–144)
Total Protein: 7.2 g/dL (ref 6.0–8.5)
eGFR: 88 mL/min/{1.73_m2} (ref >59)

## 2022-08-02 LAB — TSH: TSH: 1.89 u[IU]/mL (ref 0.450–4.500)

## 2022-08-03 ENCOUNTER — Other Ambulatory Visit: Payer: Self-pay | Admitting: Physician Assistant

## 2022-08-03 DIAGNOSIS — R1909 Other intra-abdominal and pelvic swelling, mass and lump: Secondary | ICD-10-CM

## 2022-08-03 DIAGNOSIS — N390 Urinary tract infection, site not specified: Secondary | ICD-10-CM

## 2022-08-03 LAB — SPECIMEN STATUS REPORT

## 2022-08-03 LAB — URINE CULTURE

## 2022-08-03 MED ORDER — CEPHALEXIN 500 MG PO CAPS
500.0000 mg | ORAL_CAPSULE | Freq: Two times a day (BID) | ORAL | 0 refills | Status: DC
Start: 2022-08-03 — End: 2023-02-27

## 2022-08-03 NOTE — Progress Notes (Signed)
Please, let pt know that some of her labs are back and stable for her, except her high cholesterol levels. The 10-year ASCVD risk score (rick for heart attack and stroke) is: 9.1%. Advised to start statin. I will call in medication if she agrees. Normal A1C and TSH. Will discuss the rest with her in person during her next visit.

## 2022-08-10 ENCOUNTER — Ambulatory Visit
Admission: RE | Admit: 2022-08-10 | Discharge: 2022-08-10 | Disposition: A | Discharge status: Home / Self Care | Payer: Managed Care, Other (non HMO) | Source: Ambulatory Visit | Attending: Physician Assistant | Admitting: Physician Assistant

## 2022-08-10 DIAGNOSIS — R1909 Other intra-abdominal and pelvic swelling, mass and lump: Secondary | ICD-10-CM

## 2022-08-15 ENCOUNTER — Encounter: Payer: Self-pay | Admitting: Physician Assistant

## 2022-08-18 NOTE — Progress Notes (Signed)
Please, let pt know that results was relatively benign. For further evaluation , advised CT pelvis with contrast. Please, ask pt if she wants to proceed with CT

## 2022-09-01 ENCOUNTER — Ambulatory Visit (INDEPENDENT_AMBULATORY_CARE_PROVIDER_SITE_OTHER): Payer: Managed Care, Other (non HMO) | Admitting: Physician Assistant

## 2022-09-01 ENCOUNTER — Encounter: Payer: Self-pay | Admitting: Physician Assistant

## 2022-09-01 VITALS — BP 137/80 | HR 91 | Temp 98.2°F | Ht 65.0 in | Wt 237.7 lb

## 2022-09-01 DIAGNOSIS — Z0000 Encounter for general adult medical examination without abnormal findings: Secondary | ICD-10-CM

## 2022-09-01 DIAGNOSIS — F17200 Nicotine dependence, unspecified, uncomplicated: Secondary | ICD-10-CM

## 2022-09-01 DIAGNOSIS — E669 Obesity, unspecified: Secondary | ICD-10-CM

## 2022-09-01 DIAGNOSIS — Z1231 Encounter for screening mammogram for malignant neoplasm of breast: Secondary | ICD-10-CM

## 2022-09-01 DIAGNOSIS — Z1239 Encounter for other screening for malignant neoplasm of breast: Secondary | ICD-10-CM

## 2022-09-01 MED ORDER — BUPROPION HCL ER (XL) 150 MG PO TB24
ORAL_TABLET | ORAL | 1 refills | Status: DC
Start: 2022-09-01 — End: 2022-10-26
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
## 2022-10-12 ENCOUNTER — Other Ambulatory Visit: Payer: Self-pay | Admitting: Oncology

## 2022-10-12 DIAGNOSIS — Z006 Encounter for examination for normal comparison and control in clinical research program: Secondary | ICD-10-CM

## 2022-10-13 ENCOUNTER — Ambulatory Visit: Payer: Managed Care, Other (non HMO) | Admitting: Physician Assistant

## 2022-10-24 ENCOUNTER — Other Ambulatory Visit
Admission: RE | Admit: 2022-10-24 | Discharge: 2022-10-24 | Disposition: A | Discharge status: Home / Self Care | Payer: Managed Care, Other (non HMO) | Source: Ambulatory Visit | Attending: Oncology | Admitting: Oncology

## 2022-10-24 DIAGNOSIS — Z006 Encounter for examination for normal comparison and control in clinical research program: Secondary | ICD-10-CM | POA: Insufficient documentation

## 2022-10-26 ENCOUNTER — Telehealth: Payer: Self-pay | Admitting: Physician Assistant

## 2022-10-26 ENCOUNTER — Other Ambulatory Visit: Payer: Self-pay

## 2022-10-26 DIAGNOSIS — F17200 Nicotine dependence, unspecified, uncomplicated: Secondary | ICD-10-CM

## 2022-10-26 DIAGNOSIS — E669 Obesity, unspecified: Secondary | ICD-10-CM

## 2022-10-26 NOTE — Telephone Encounter (Signed)
[facility] faxed refill request for the following medications:   buPROPion (WELLBUTRIN XL) 150 MG 24 hr tablet    Message from pharmacy: Pt needs a 90 day supply for her insurance to pay for this medication.  Please send in a 90 day rx.  Thanks   Please advise.

## 2022-10-26 NOTE — Telephone Encounter (Signed)
Refill sent to provider

## 2022-11-01 MED ORDER — BUPROPION HCL ER (XL) 150 MG PO TB24
ORAL_TABLET | ORAL | 1 refills | Status: DC
Start: 2022-11-01 — End: 2023-02-07

## 2022-11-03 ENCOUNTER — Ambulatory Visit: Payer: Managed Care, Other (non HMO) | Admitting: Physician Assistant

## 2022-11-21 LAB — GENECONNECT MOLECULAR SCREEN

## 2022-11-23 ENCOUNTER — Telehealth: Payer: Self-pay | Admitting: *Deleted

## 2022-11-23 NOTE — Telephone Encounter (Signed)
[place] [facility]  Confirmed I was speaking with MAISON KUHNS [id number] by using name and DOB. Thanked her for participating in this study.  Informed participant the reason for this call is to follow-up on a recent blood sample the participant provided at one of the [facility] lab locations. Informed participant the test was not able to be completed with this sample and apologized for the inconvenience. Participant was requested to provide a new sample at one of our participating labs at no cost so that participant can continue participation and receive test results.  Participant agreed to provide another sample.  She was informed that at the moment, the program is undergoing an internal review and is temporarily unable to process new samples.  The participant was told that an exact date to resume collecting new samples has not been determined.  The participant was told that an email will be sent to all participants when new collections resume.  The participant verbalized understanding and had no questions for the nurse. Participant was thanked for their time and continued support of the above study.    Janan Ridge RN, BSN, CCRP Clinical Research Nurse Lead 11/23/2022 2:39 PM

## 2022-11-23 NOTE — Telephone Encounter (Signed)
GeneConnect Study   This nurse received a notification - TNP- test not performed for this participant.  Nurse attempted to reach patient by phone to notify her that her sample was not able to be processed, and she will need to provide another sample.  The nurse was also going to inform the pt that currently the study is undergoing an internal review and new samples are temporarily unable to be processed at this time.  A date to resume collecting new samples has not been determined.  The pt was unavailable by phone, and the nurse left a voice message asking her to return the nurse's call at her earliest convenience.   Janan Ridge RN, BSN, CCRP Clinical Research Nurse Lead 11/23/2022 2:10 PM

## 2022-11-24 ENCOUNTER — Ambulatory Visit: Payer: Managed Care, Other (non HMO) | Admitting: Physician Assistant

## 2022-12-22 ENCOUNTER — Ambulatory Visit: Payer: Managed Care, Other (non HMO) | Admitting: Physician Assistant

## 2022-12-25 ENCOUNTER — Other Ambulatory Visit: Payer: Self-pay | Admitting: Medical Genetics

## 2022-12-25 DIAGNOSIS — Z006 Encounter for examination for normal comparison and control in clinical research program: Secondary | ICD-10-CM

## 2022-12-25 NOTE — Progress Notes (Unsigned)
Additional order as first was TNP

## 2023-01-16 ENCOUNTER — Ambulatory Visit: Payer: Managed Care, Other (non HMO) | Admitting: Physician Assistant

## 2023-02-05 ENCOUNTER — Other Ambulatory Visit: Payer: Self-pay | Admitting: Physician Assistant

## 2023-02-05 DIAGNOSIS — E669 Obesity, unspecified: Secondary | ICD-10-CM

## 2023-02-05 DIAGNOSIS — F17200 Nicotine dependence, unspecified, uncomplicated: Secondary | ICD-10-CM
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
## 2023-02-27 ENCOUNTER — Telehealth: Payer: Managed Care, Other (non HMO) | Admitting: Physician Assistant

## 2023-02-27 DIAGNOSIS — R3989 Other symptoms and signs involving the genitourinary system: Secondary | ICD-10-CM | POA: Diagnosis not present

## 2023-02-27 MED ORDER — CEPHALEXIN 500 MG PO CAPS: 500.0000 mg | ORAL_CAPSULE | Freq: Two times a day (BID) | ORAL | 0 refills | Status: AC

## 2023-02-27 NOTE — Progress Notes (Signed)
I have spent 5 minutes in review of e-visit questionnaire, review and updating patient chart, medical decision making and response to patient.   Mia Milan Cody Jacklynn Dehaas, PA-C    

## 2023-02-27 NOTE — Progress Notes (Signed)

## 2023-03-23 ENCOUNTER — Ambulatory Visit: Payer: Managed Care, Other (non HMO) | Admitting: Physician Assistant

## 2023-10-01 ENCOUNTER — Ambulatory Visit: Admitting: Nurse Practitioner

## 2024-01-01 ENCOUNTER — Other Ambulatory Visit: Payer: Self-pay | Admitting: Medical Genetics

## 2024-01-01 DIAGNOSIS — Z006 Encounter for examination for normal comparison and control in clinical research program: Secondary | ICD-10-CM

## 2024-03-13 ENCOUNTER — Encounter: Admitting: Nurse Practitioner

## 2024-04-18 ENCOUNTER — Encounter: Payer: Self-pay | Admitting: Nurse Practitioner
